# Patient Record
Sex: Female | Born: 1975 | Race: White | Hispanic: No | Marital: Married | Smoking: Never smoker
Health system: Southern US, Academic
[De-identification: ages and names within clinical notes are randomized; demographics above are authoritative.]

## PROBLEM LIST (undated history)

## (undated) DIAGNOSIS — E039 Hypothyroidism, unspecified: Secondary | ICD-10-CM

## (undated) DIAGNOSIS — E119 Type 2 diabetes mellitus without complications: Secondary | ICD-10-CM

## (undated) DIAGNOSIS — N83209 Unspecified ovarian cyst, unspecified side: Secondary | ICD-10-CM

## (undated) DIAGNOSIS — F411 Generalized anxiety disorder: Secondary | ICD-10-CM

## (undated) DIAGNOSIS — M5116 Intervertebral disc disorders with radiculopathy, lumbar region: Secondary | ICD-10-CM

## (undated) DIAGNOSIS — E041 Nontoxic single thyroid nodule: Secondary | ICD-10-CM

## (undated) DIAGNOSIS — E559 Vitamin D deficiency, unspecified: Secondary | ICD-10-CM

## (undated) DIAGNOSIS — I10 Essential (primary) hypertension: Secondary | ICD-10-CM

## (undated) DIAGNOSIS — M1990 Unspecified osteoarthritis, unspecified site: Secondary | ICD-10-CM

## (undated) DIAGNOSIS — M25562 Pain in left knee: Secondary | ICD-10-CM

## (undated) DIAGNOSIS — E669 Obesity, unspecified: Secondary | ICD-10-CM

## (undated) DIAGNOSIS — M25561 Pain in right knee: Secondary | ICD-10-CM

## (undated) DIAGNOSIS — K529 Noninfective gastroenteritis and colitis, unspecified: Secondary | ICD-10-CM

## (undated) DIAGNOSIS — K219 Gastro-esophageal reflux disease without esophagitis: Secondary | ICD-10-CM

## (undated) DIAGNOSIS — I83899 Varicose veins of unspecified lower extremities with other complications: Secondary | ICD-10-CM

## (undated) DIAGNOSIS — K7581 Nonalcoholic steatohepatitis (NASH): Secondary | ICD-10-CM

## (undated) HISTORY — DX: Essential (primary) hypertension: I10

## (undated) HISTORY — PX: HX GALL BLADDER SURGERY/CHOLE: SHX55

## (undated) HISTORY — DX: Pain in right knee: M25.561

## (undated) HISTORY — DX: Pain in left knee: M25.562

## (undated) HISTORY — DX: Nontoxic single thyroid nodule: E04.1

## (undated) HISTORY — DX: Intervertebral disc disorders with radiculopathy, lumbar region: M51.16

## (undated) HISTORY — DX: Type 2 diabetes mellitus without complications: E11.9

## (undated) HISTORY — DX: Nonalcoholic steatohepatitis (NASH): K75.81

## (undated) HISTORY — DX: Noninfective gastroenteritis and colitis, unspecified: K52.9

## (undated) HISTORY — DX: Generalized anxiety disorder: F41.1

## (undated) HISTORY — DX: Gastro-esophageal reflux disease without esophagitis: K21.9

## (undated) HISTORY — DX: Unspecified ovarian cyst, unspecified side: N83.209

## (undated) HISTORY — DX: Unspecified osteoarthritis, unspecified site: M19.90

## (undated) HISTORY — PX: CARDIAC CATHETERIZATION: SHX172

## (undated) HISTORY — PX: COLONOSCOPY: WVUENDOPRO10

## (undated) HISTORY — DX: Vitamin D deficiency, unspecified: E55.9

## (undated) HISTORY — DX: Obesity, unspecified: E66.9

## (undated) HISTORY — PX: HX UPPER ENDOSCOPY: 2100001144

## (undated) HISTORY — DX: Hypothyroidism, unspecified: E03.9

## (undated) HISTORY — PX: HX ENDOMETRIAL ABLATION: 2100001129

## (undated) HISTORY — DX: Varicose veins of unspecified lower extremity with other complications: I83.899

---

## 2003-04-01 ENCOUNTER — Other Ambulatory Visit (HOSPITAL_COMMUNITY): Payer: Self-pay | Admitting: OBSTETRICS/GYNECOLOGY

## 2021-04-06 IMAGING — MG 3D SCREENING MAMMO BIL W/CAD & TOMO
5 series · 8 of 24 positions shown · non-contrast
Comparison: Study dated 06/18/2020.

------------- REPORT [id number] -------------
[facility] [place]
[address]
Tiger Ms.KOCI, GALO PATRICIO:
We wish to report the following on your recent mammography examination. We are sending a report to your referring physician or other health care provider. 
(       Normal/Negative:
No evidence of cancer.
This statement is mandated by the [place], Department of Health.
Your examination was performed by one of our technologists, who are registered radiological technologists and also specially certified in mammography:
___
Markland, Marjuan (M)

Your mammogram was interpreted by our radiologist.
( 
Collette Sedman, M.D.
(Annual Breast Examination by a physician or other health care provider
(Annual Mammography Screening beginning at age 39
(Monthly Breast Self Examination
------------- REPORT [id number] -------------
﻿
CIPOLLA, TETYANA
EXAM:  BILATERAL DIGITAL SCREENING MAMMOGRAM WITH 3D TOMOSYNTHESIS AND CAD
INDICATION: Asymptomatic 44-year-old with no family history of breast cancer in first-degree relatives.  Lifetime breast cancer risk 11.5%.

[R]
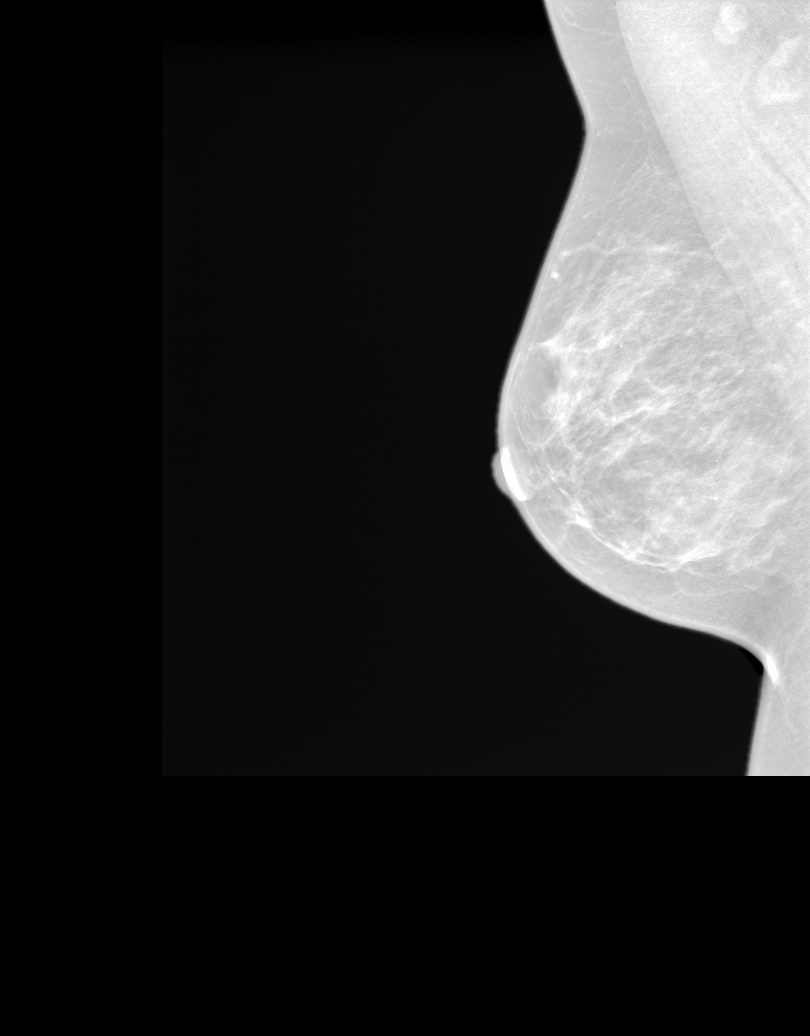

[L]
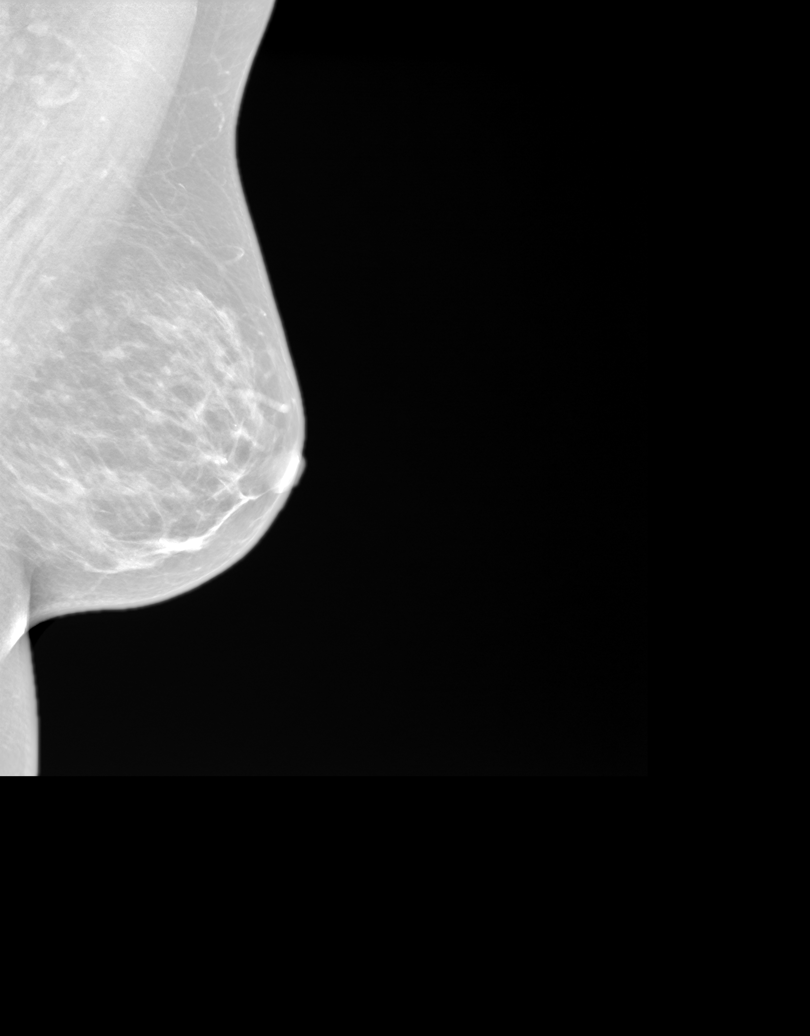

[R CC tomo · right · 0.10mm/px · 2 of 2 slices shown]
[im 1/2]
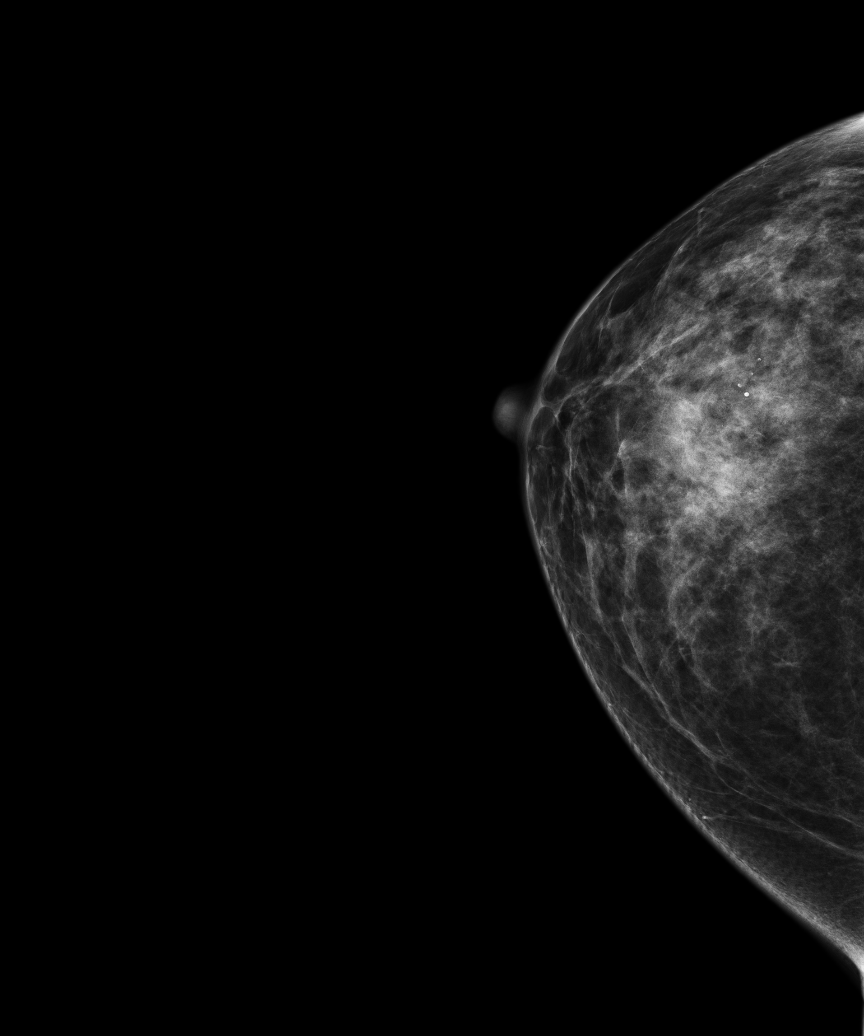
[im 2/2]
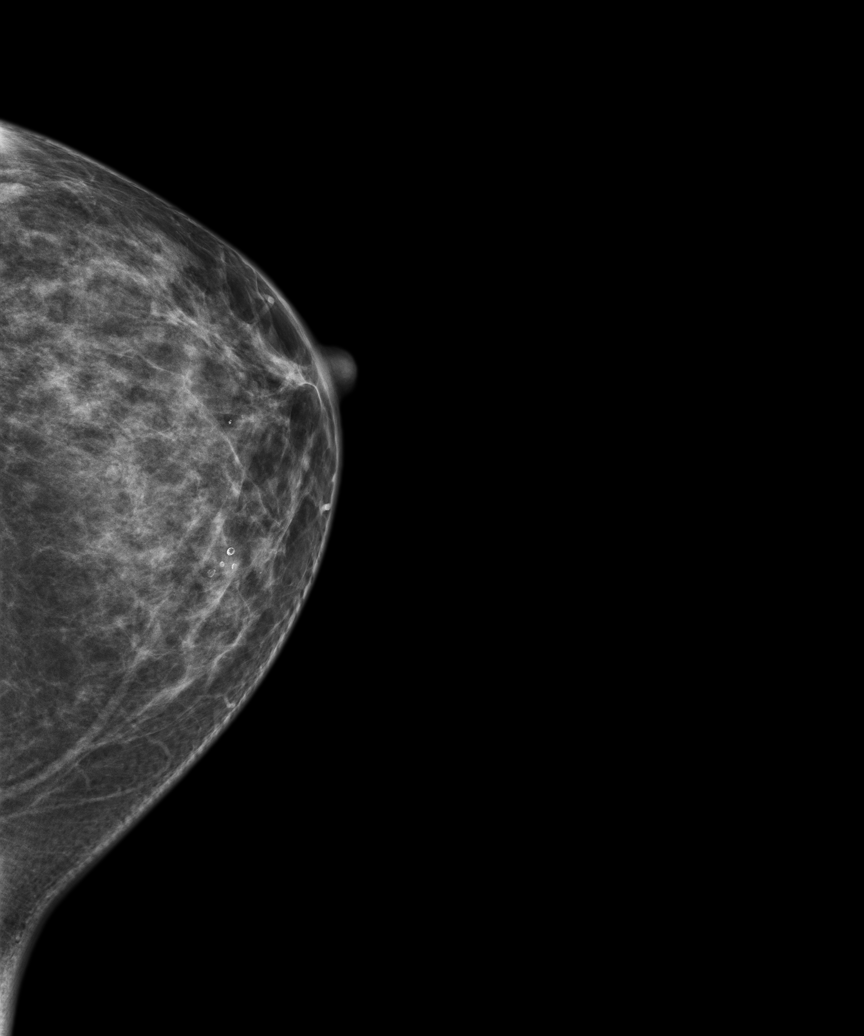

[3D SCREENING MAMMO BIL W/CAD & TOMO tomo · 2 acquisitions, 3 frames shown (1 of 2)]
[im 1/2]
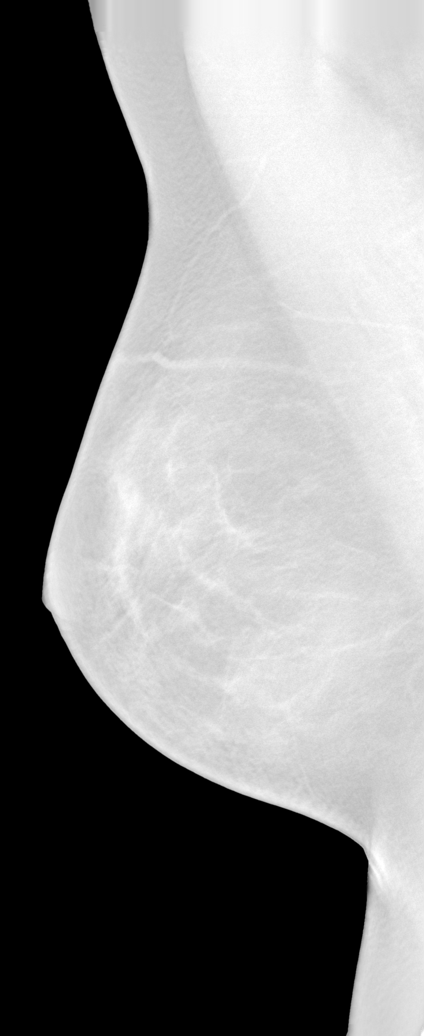
[im 2/2]
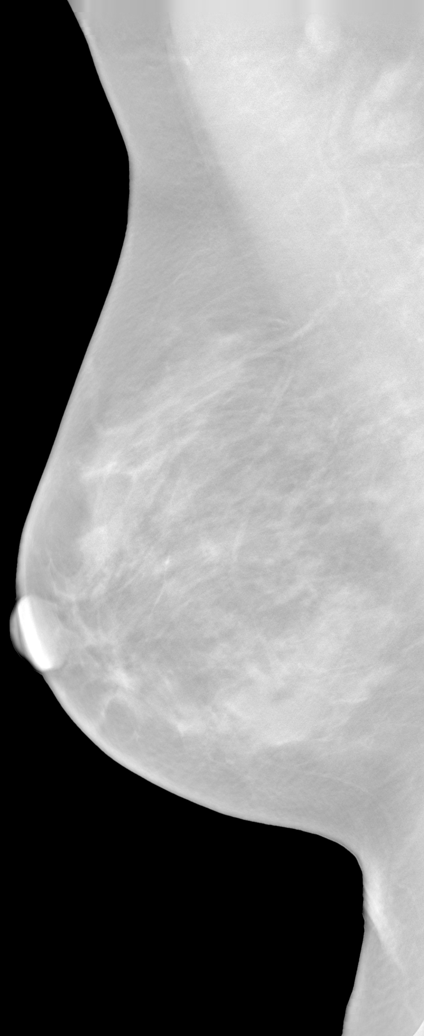
[im 2/2]
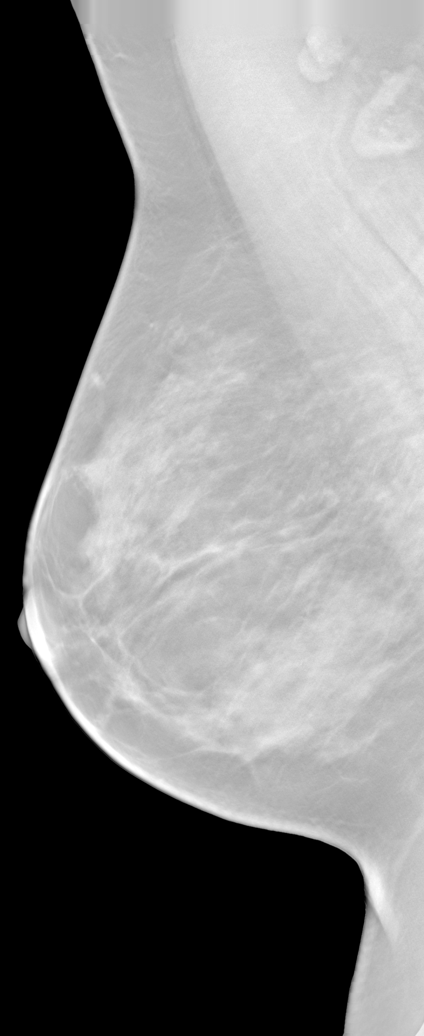

[3D SCREENING MAMMO BIL W/CAD & TOMO tomo (2 of 2) · tomo slice 10/63.0]
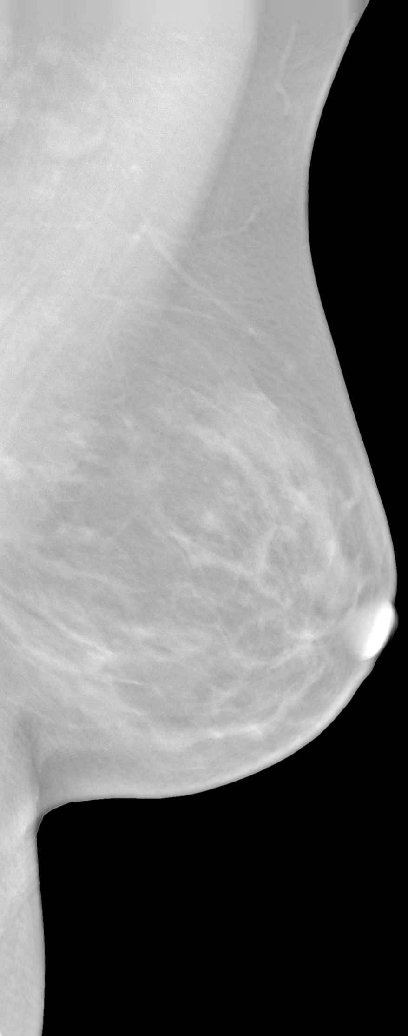

[8 of 24 positions shown; findings below may reference images not displayed]

FINDINGS: No focal mass or architectural changes are noted.  Asymmetry of density in the inferior right breast compared to the left side is stable as well as benign lymph nodes in the axillae with fatty hila.
IMPRESSION: 1.  Stable mammographic findings.  Clinical and mammographic followup at 12 months. 

2.  BIRADS 2-Benign findings. Patient has been added in a reminder system with a target date for the next screening mammography.

3.  DENSITY CODE – B (Scattered areas of fibroglandular density). 

Final Assessment Code:

Bi-Rads 2 

BI-RADS 0
 Need additional imaging evaluation.

BI-RADS 1
 Negative mammogram.

BI-RADS 2
 Benign finding.

BI-RADS 3
 Probably benign finding; short-interval follow-up suggested.

BI-RADS 4
 Suspicious abnormality; biopsy should be considered.

BI-RADS 5
 Highly suggestive of malignancy; appropriate action should be taken.

BI-RADS 6
 Known biopsy-proven malignancy; appropriate action should be taken.

NOTE:
In compliance with Federal regulations, the results of this mammogram are being sent to the patient.

## 2022-01-02 ENCOUNTER — Other Ambulatory Visit (RURAL_HEALTH_CENTER): Payer: Self-pay | Admitting: Internal Medicine

## 2022-01-21 ENCOUNTER — Encounter (RURAL_HEALTH_CENTER): Payer: Self-pay | Admitting: Internal Medicine

## 2022-01-21 DIAGNOSIS — K529 Noninfective gastroenteritis and colitis, unspecified: Secondary | ICD-10-CM | POA: Insufficient documentation

## 2022-01-21 DIAGNOSIS — E559 Vitamin D deficiency, unspecified: Secondary | ICD-10-CM | POA: Insufficient documentation

## 2022-01-21 DIAGNOSIS — I83899 Varicose veins of unspecified lower extremities with other complications: Secondary | ICD-10-CM | POA: Insufficient documentation

## 2022-01-21 DIAGNOSIS — E119 Type 2 diabetes mellitus without complications: Secondary | ICD-10-CM | POA: Insufficient documentation

## 2022-01-21 DIAGNOSIS — M25562 Pain in left knee: Secondary | ICD-10-CM

## 2022-01-21 DIAGNOSIS — E039 Hypothyroidism, unspecified: Secondary | ICD-10-CM | POA: Insufficient documentation

## 2022-01-21 DIAGNOSIS — M1990 Unspecified osteoarthritis, unspecified site: Secondary | ICD-10-CM | POA: Insufficient documentation

## 2022-01-21 DIAGNOSIS — E041 Nontoxic single thyroid nodule: Secondary | ICD-10-CM | POA: Insufficient documentation

## 2022-01-21 DIAGNOSIS — F411 Generalized anxiety disorder: Secondary | ICD-10-CM | POA: Insufficient documentation

## 2022-01-21 DIAGNOSIS — N83209 Unspecified ovarian cyst, unspecified side: Secondary | ICD-10-CM | POA: Insufficient documentation

## 2022-01-21 DIAGNOSIS — I10 Essential (primary) hypertension: Secondary | ICD-10-CM | POA: Insufficient documentation

## 2022-01-21 DIAGNOSIS — E669 Obesity, unspecified: Secondary | ICD-10-CM

## 2022-01-21 DIAGNOSIS — M5116 Intervertebral disc disorders with radiculopathy, lumbar region: Secondary | ICD-10-CM | POA: Insufficient documentation

## 2022-01-21 DIAGNOSIS — K219 Gastro-esophageal reflux disease without esophagitis: Secondary | ICD-10-CM | POA: Insufficient documentation

## 2022-01-21 DIAGNOSIS — K7581 Nonalcoholic steatohepatitis (NASH): Secondary | ICD-10-CM | POA: Insufficient documentation

## 2022-01-21 DIAGNOSIS — M25561 Pain in right knee: Secondary | ICD-10-CM | POA: Insufficient documentation

## 2022-01-21 NOTE — Progress Notes (Signed)
[place] [facility]  Patient:  Sue Rose, Sue Rose #: [id number]  Date of Service: 10/15/21 MR #: [id number]  Provider: Rico Ala P.A.C. PCP: Rico Ala P.A.C.  DOB: [birth year] Age/Sex: 45/F Referring Provider:     Internal Medicine VisitSignedHPI  Adult Physical-AMB  [id number]  Pneumo 23 status: Unimmunized  Immunization given today: no  PCV13/PREVNAR  Prevnar 13 status: Unimmunized  Immunization given today: no  PREVNAR 20  Prevnar 20 status: Unimmunized  Immunization given today: no  COVID 19  Covid immunization status: Unimmunized  Immunization given today: no  INFLUENZA VACCINE  Flu immunization status: Unimmunized  Immunization given today: no  TETANUS/DIPTHERIA/PERTUSSIS Tdap  Immunization Status: Unimmunized  Immunization given today: no  SHINGRIX VACCINE  Immunization Status: Unimmunized  Immunization given today: no  BREAST CANCER SCREENING  Guidelines: 1.) Women of average risk should be given the option of annual screening starting at age 65.  2.) Women with increased risk (breast cancer in parent, sibling, child, BRCA 1/BRCA2 positive) may require more intense screening. Medicare allows one baseline Mammogram at age 52-39, then 1 annually.  BREAST CANCER RISK  Personal history of breast cancer: no  Family history of breast cancer: no  GYNE HX/STD PREVENTION/FAMILY PLANNING  Applicable history only  Menopause concerns: Denies Weight gain, Decreased libido, Other, None, Hot flashes or Vaginal dryness  Menstrual character: Denies normal, abnormally heavy, irregular in timing, absent, prolonged or other  Gynecologic pain symptoms: Denies None, Dysmenorrhea, Chronic noncyclic pelvic pain, Dyspareunia, Pelvic pressure or Other  Urogynecologic Symptoms: Reports Urinary urgency and Urinary frequency; Denies Incontinence, Leakage with cough/sneeze/strain/laugh, Post void dribbling or Other  UTI in the last 6month?:: No  PAP SMEAR/HPV,CHLAMYDIA  TESTING/ PELVIC  GUIDELINES:Low risk cervical/vaginal Ca  Under age 552 No pap smears  Chlamydia Screeening: Do on Sexually Active females under age 47  Ages 21-65: Cytology alone, every 3 years.  Age 47- 315 Cytology every 3 years with reflex to HRHPV screening if abnormal.  Age 47- 688 Cytology and HRHPV every 5 years if prior screening adequate and normal OR Cytology alone every 3years.  Age over 644 No further Pap Smears if prior screening adequate.  Prior Hysterectomy: No further screening if surgery was done for non cancerous reasons.  Pelvic Exam suggested every 250yrfor low risk patients.  GUIDELINES:High risk cervical/vaginal Ca  Those with hx of C1N2, C1N3, hx of Cervical CA, those in child bearing age w/abnormal pap within last 3 yrs. Annual Exam required x2011yr HIV/HEP B/C RISK SCREENING  GUIDELINES: HIV: Offer screening ages 15-21-65nually without regard to perceived risk.  Annually for those younger than 15 71d older than 65 83o are at increased risk.  Hep B: Screen only high risk.  Hep C: Screen once regardless of risk, age 92-79 with HCV Ab testing with PCR confirmation for abnormal results. Screen again only if new exposure.  PATIENT HIV/HEPB/HEPC RISK ASSESSMENT  RISK ASSESSMENT:: Reports Tattoos; Denies IV drug user, Blood transfusion before 1992, Household member has HIV/HEPTB/C, Needlestick injury, Occupational risk, Patient Hept B immunized or Other  COLON CANCER SCREENING  Patients are AVERAGE RISK IF ALL OF THE BELOW ARE NEGATIVE.: denies Personal hx of colon cancer  IF patient is AVERAGE RISK USPTF Guidelines are as follows:  * Offer screening starting at age 61 4d continue to age 59 61 recommendation for age 39-40-75d B recommendation age 61-72-49* Discuss benefits vs risks of screening between age 53-16-85nsidering patients overall  health, prior screening hx and patient preferences, C recommendation)  * STOP screening at age 48  OPTIONS for testing are as follows:  *  High-sensitivity gFOBT or FIT every year  * sDNA-FIT every 1-3 years  * CT colonography every 5 years  * Flexible sigmoidoscopy every 5 years  * Flexible sigmoidoscopy every 10 years PLUS FIT every year  * Colonoscopy screening every 10 years  NOTE:  Any abnormality detected on screening test should be followed by a DIAGNOSTIC colonoscopy.  OSTEOPOROSIS SCREENING/TREATMENT  2018 USPSTF Recommendations for Primary Prevention of Osteoporotic Fracture:  FEMALE GENOTYPE 65 and OVER:  The USPSTF recommends screening for osteoporosis with bone measurement testing to prevent osteoporotic fractures in women 65 years and older. (B recommendation)  The USPSTF recommends screening for osteoporosis with bone measurement testing to prevent osteoporotic fractures in postmenopausal women younger than 65 years at increased risk of osteoporosis,  as determined by a formal clinical risk assessment tool. (B recommendation):  FEMALE GENOTYPE UNDER AGE 42:  Risk Assessment:: denies Parental Hx of Hip fracture  In addition, menopausal status in women is also an important consideration. For postmenopausal women younger than 56 years who have at least 1 of the above risk factors,  a reasonable approach to determine who should be screened with bone measurement testing is to use a clinical risk assessment tool.  Several tools are available to assess osteoporosis risk, such as OST (less than 2), ORAI (9 or greater), OSIRIS (less than 1), SCORE (6 or greater) and FRAX (overall risk >8.4%).  The OST tool is suggested due to simplicity and no evidence that it is inferior to others.  FEMALE GENOTYPE:  The USPSTF concludes that the current evidence is insufficient to assess the balance of benefits and harms of screening for osteoporosis to prevent osteoporotic fractures in men. (I statement)  PREVIOUS BONE DENSITY TESTING  VISION/GLAUCOMA SCREENING  Recommendation: Yearly to biyearly exam with Family history, Diabetes, African American (age50),  Hispanic American.  Findings: Reports Need for glasses; Denies Cataracts, Glaucoma, DM retinopathy or Macular Degeneration  Vision Acuity Performed Today  DEPRESSION SCREENING  1. Little interest or pleasure in doing things: not at all  2. Feeling down, depressed, or hopeless: several days  PHQ-2: Total score: 1  NUTRITIONAL ASSESSMENT  Dietary habits: Reports well-balanced diet, eats out and daily servings of fruits and vegetables  1. How many times a week did you eat fast food meals or snacks?: 1-3 times  2. How many servings of fruit did you eat each day?: 1-3 times  3. How many servings of vegetables did you eat each day?: 5 or more  4. How many nights a week do you eat suppers prepared at home?: 4 or more times  VITAMIN SUPPLEMENT USE  Do you take any of the following vitamins?: Reports Vitamin D  CALCIUM INTAKE  Dairy product consumption:: Reports Milk, Yogurt and Cheese; Denies Icecream  EXERCISE HABITS  Patient reports the following activites:: Denies Walking, Hiking, Running, Swimming, Basketball/Volleyball/Soccer, Racquet sports, Exercise classes, Golfing, Cycling, Rowing, Fishing, [employer] lifting, Yoga, TaiChi, Karate, Boxing or Other  SKIN CANCER RISK ASSESSMENT  History of Skin Cancer: No  Fair Skin: yes  History of SunBurn: yes  Uses SunScreen: yes  ALCOHOL MISUSE SCREENING  GUIDELINES: Current for safe consumption  Men 56 and older. ALL Women: < 7 drinks per week or < 3 drinks per occasion.  Men under age 68: < 14 drinks per week or < 4 drinks per occasion.  How often do you have a drink containing alcohol?: never  How often do you have 6 or more drinks on 1 occasion?: never  CARDIOVASCULAR RISK EVALUATION  Risk Factors: Reports Hypertension, Diabetes and Family Hx of Heart Disease  Fasting lipid panel: Medicare covers all beneficiaries without apparent signs or symptoms of cardiovascular disease once every 5 years.  ADVANCED CARE PLANNING  Advance Directives: No  Does the patient have a Medical Power  of Attorney?: No  Living Will: No  [place] DNR: No  SAFETY  Car Safety: Wears a seatbelt and Never texts and drives  Home safety: Reports water heater temperature set to <120 degrees, has working Oceanographer in home, has a Data processing manager in the home and has a working carbon Media planner in the home      Intake  Vital Signs      10/15/21  15:38  Height   5 ft 5 in  Weight   244 lb  BMI   40.6  BP   158/90 H  Blood Pressure Location   Rt femoral  Position   Sitting  Pulse   82  Pulse Source   Monitor  Temp   98.3 F  Temp Source   Tympanic  Pulse Oximetry (%)   100  Oxygen Delivery Method   room air    Chief Complaint:  adult health maintenance  Add today's problem/HPI: Adult health maintenance  Allergies    Allergy/AdvReac Type Severity Reaction Status Date / Time  ACE Inhibitors AdvReac Mild Cough Verified 01/24/21 11:58      Medication List     Medication  Instructions  Recorded  Confirmed  cholecalciferol (vitamin D3) 1,250 50,000 unit PO QWEEK #12 cap 06/07/21 10/27/21  mcg (50,000 unit) capsule        famotidine 40 mg tablet 40 mg PO BID 90 Days #180 tab 06/07/21 10/27/21  hydrochlorothiazide 25 mg tablet 25 mg PO QAM #90 tab 06/07/21 10/27/21  levothyroxine 50 mcg tablet See Rx Instructions .ROUTE 06/07/21 10/27/21    .COMPLEX #90 tab      olmesartan 40 mg tablet (Benicar) 40 mg PO QDAY #90 tab 06/07/21 10/27/21  omeprazole 40 mg capsule,delayed 40 mg PO QDAY #90 cap 06/07/21 10/27/21  release        celecoxib 200 mg capsule (Celebrex) 200 mg PO BID #60 cap 06/08/21 10/27/21  montelukast 10 mg tablet 10 mg PO QDAY #30 tab 06/29/21 10/27/21  cetirizine 10 mg tablet (Zyrtec) 10 mg PO QDAY PRN 10/15/21 10/27/21  duloxetine 30 mg capsule,delayed 30 mg PO QDAY #30 cap 10/15/21 10/15/21  release (Cymbalta)        sitagliptin phosphate 50 mg tablet 50 mg PO QDAY #30 tab 10/15/21 10/15/21  (Januvia)        fluconazole 100 mg tablet 100 mg PO QDAY #5 tab 10/23/21    (Diflucan)            Patient  bottles, verbal confirmation, RX history and old medical records are used to get the best medication list possible.: MA- Meds Reviewed and Provider reconciled      Patient Problem List/History  Active Problem List (Updated 10/27/21 @ 18:35 by Rico Ala, P.A.C.)    Type 2 diabetes mellitus without complication (Acute) A16.5  Leg pain, bilateral (Acute) M79.604, M79.605  Symptomatic varicose veins (Acute) I83.899  Lab test positive for detection of COVID-19 virus (Acute) U07.1  GAD (generalized anxiety disorder) (Acute) F41.1  Dermatitis (Acute) L30.9  Other abnormal glucose (  Acute) R73.09  Head ache (Acute) R51.9  Gastroenteritis (Acute) K52.9  Obesity (Acute) E66.9  Bilateral knee pain (Acute) M25.561, M25.562  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 (Acute) M51.16  DJD (degenerative joint disease), lumbar (Acute) M47.816  Thyroid nodule (Acute) E04.1  Vitamin D deficiency (Acute) E55.9  NASH (nonalcoholic steatohepatitis) (Acute) K75.81  GERD (gastroesophageal reflux disease) (Acute) K21.9  Essential hypertension (Acute) I10  Back pain (Acute) M54.9  Pain of left lower extremity (Acute) M79.605  Hypothyroidism E03.9      Past Medical History (Updated 10/27/21 @ 18:35 by Rico Ala, P.A.C.)    Cyst of ovary N83.209  Lab test positive for detection of COVID-19 virus U07.1    Surgical History (Updated 10/15/21 @ 16:13 by Rico Ala, P.A.C.)    History of cholecystectomy Z90.49  History of gynecologic surgery Z98.890  ablation late  30's  Previous cesarean section Z98.891        ROS  Const  Denies body aches, Denies chills, Denies fatigue and Denies fever(s)  Eyes  Denies diplopia, Denies loss of vision and Denies spots in vision  ENT  Denies dizziness, Reports post nasal drip and Denies sinus pain  Card  Reports chest pain, Denies pedal edema, Denies leg edema and Denies dyspnea  Resp  Denies chest congestion, Denies cough and Denies dyspnea  GI  Denies diarrhea, Denies  nausea and Denies vomiting  Neuro  Denies dizziness and Denies loss of vision  Endo  Denies fatigue      Exam-AMB  EXAM  EXAM:  pt crying at times during this visit   Const  General: cooperative and no acute distress  Orientation/Consciousness: patient oriented x3  HENMT  Ears: hearing grossly normal bilaterally, TM normal on the right and TM normal on the left  General nose exam: nares normal  Eyes  General: appearance normal, both eyes and all related structures  Conjunctivae: conjunctivae normal  Sclera: sclerae normal  EOM: EOM intact bilaterally  Neck  Neck: normal visual inspection and no lymphadenopathy  Thyroid: thyroid normal  Carotids: no bruits  Resp  Effort & Inspection: normal respiratory effort  Auscultation: clear to auscultation bilaterally, no crackles, no rales, no rhonchi, no wheezes and no rubs  Tactile Fremitus: tactile fremitus absent  Cardio  Jugular venous pressure: no JVD  Rate: regular rate  Rhythm: regular rhythm  Heart Sounds: S1 normal, S2 normal, no click, no murmurs and no rubs  Bruits: no carotid bruits  Pulses: normal peripheral pulses  GI  Inspection: Yes normal to inspection  Palpation: soft, no hepatosplenomegaly, no guarding, no masses and nontender  Auscultation: normal bowel sounds  GU  Other:  declined  gyn  Neuro  General: patient oriented x3  Psych  Appearance: grossly normal  Mental Status: mental status grossly normal  Speech and Movement: speech clear  Mood: congruent mood  Affect: sad  Attitude: cooperative  Thought Process: normal  Thought Content: normal  Insight: insight good  Judgment: judgment good        A/P  Assessment & Plan  (1) Encounter for Health Maintenance Examination in Adult:        Code(s):  Z00.00 - Encounter for general adult medical examination without abnormal findings  (2) Essential hypertension:        Status: Acute        Code(s):  I10 - Essential (primary) hypertension  (3) Leg pain, bilateral:        Status: Acute  Code(s):  M79.604 - Pain  in right leg; M79.605 - Pain in left leg  (4) Vitamin D deficiency:        Status: Acute        Code(s):  E55.9 - Vitamin D deficiency, unspecified  (5) Symptomatic varicose veins:        Status: Acute        Code(s):  I83.899 - Varicose veins of unspecified lower extremity with other complications        Qualifiers:          Laterality: bilateral  Qualified Code(s): [phone] - Varicose veins of bilateral lower extremities with other complications  (6) Hypothyroidism:        Status: None        Code(s):  E03.9 - Hypothyroidism, unspecified        Qualifiers:          Hypothyroidism type: acquired  Qualified Code(s): E03.9 - Hypothyroidism, unspecified  (7) GAD (generalized anxiety disorder):        Status: Acute        Code(s):  F41.1 - Generalized anxiety disorder  (8) Obesity:        Status: Acute        Code(s):  E66.9 - Obesity, unspecified        Qualifiers:          Body mass index: BMI 40.0-44.9  Obesity classification: adult class 3 (BMI >= 40)  Obesity type: unspecified obesity type  Serious obesity comorbidity presence: without serious comorbidity  Qualified Code(s): E66.01 - Morbid (severe) obesity due to excess calories; Z68.41 - Body mass index [BMI]40.0-44.9, adult  (9) NASH (nonalcoholic steatohepatitis):        Status: Acute        Code(s):  T61.44 - Nonalcoholic steatohepatitis (NASH)  (10) GERD (gastroesophageal reflux disease):        Status: Acute        Code(s):  K21.9 - Gastro-esophageal reflux disease without esophagitis        Qualifiers:          Esophagitis presence: without esophagitis  Qualified Code(s): K21.9 - Gastro-esophageal reflux disease without esophagitis  (11) Bilateral knee pain:        Status: Acute        Code(s):  M25.561 - Pain in right knee; M25.562 - Pain in left knee        Qualifiers:          Chronicity: acute  Qualified Code(s): M25.561 - Pain in right knee; M25.562 - Pain in left knee  (12) Type 2 diabetes mellitus without complication:        Status: Acute         Code(s):  E11.9 - Type 2 diabetes mellitus without complications        Qualifiers:          Diabetes mellitus long term insulin use: without long term use  Qualified Code(s): E11.9 - Type 2 diabetes mellitus without complications    Plan  wellness on top of acute  complaints   Meds reviewed as well as labs.  Chart reviewed and updated  Continue current treatment  Keep follow-up appointment    Co Several months of  symptoms   few months ago quit her job  as she has been  more anxious and depressed   cries easily  and  finaly  just  quit  work      pt was coming in late  Leaving work did not  help her anxiety          when anxiety comes on  she  feels  she is out of breath   with exertion at times    Pt had  ativan  during  covid feels she needs something   due to chronic  and acute pain  discussed   cymbalta low dose   as pt has trouble tolerating meds     In her 30's pt was having  chest pain  numbness  in face  and had   ativan    no suicidal or homocidal ideations   in 1 week period  1 x per week   few times  per  week  feels down         Pt had ablation   no menses  and does get hot flashes      Co CP on and off   with and wo exertion  no sweats   30 min  in  length      bra  feels tight at time   and exertional sob        co pain in knees and legs     seen by  Ortho Dr Margretta Sidle   no surgery  no inj     on celebrex  200 bid  some improvement   stiffness is  worse   hard to get around    Jardiance made her  sick to stomach    never called to let us know as I could have decreased dose     Trulicity   tried and nausea          bs   254   3 pm   only  ate breakfast  this am   bs  have not been being checked                Meds reviewed as well as labs.  Chart reviewed and updated  Continue current treatment  Keep follow-up appointment            Orders:  Orders  Comprehensive Metabolic Panel 12/87/86 V67 - Essential (primary) hypertension, M79.604 - Pain in right leg, M79.605 - Pain in left leg, F41.1 -  Generalized anxiety disorder, E66.01 - Morbid (severe) obesity due to excess calories, Z68.41 - Body mass index [BMI] 40.0-44.9, adult, M25.561 - Pain in right knee, M25.562 - Pain in left knee, E55.9 - Vitamin D deficiency, unspecified, M09.47 - Nonalcoholic steatohepatitis (NASH), K21.9 - Gastro-esophageal reflux disease without esophagitis, E03.9 - Hypothyroidism, unspecified    Vitamin B12 10/16/21 E53.8 - Deficiency of other specified B group vitamins, M79.604 - Pain in right leg, M79.605 - Pain in left leg, F41.1 - Generalized anxiety disorder, E66.01 - Morbid (severe) obesity due to excess calories, Z68.41 - Body mass index [BMI] 40.0-44.9, adult, M25.561 - Pain in right knee, M25.562 - Pain in left knee, E55.9 - Vitamin D deficiency, unspecified, S96.28 - Nonalcoholic steatohepatitis (NASH), K21.9 - Gastro-esophageal reflux disease without esophagitis, I10 - Essential (primary) hypertension, E03.9 - Hypothyroidism, unspecified    Thyroid Stimulating Hormone 10/16/21 E03.9 - Hypothyroidism, unspecified, M79.604 - Pain in right leg, M79.605 - Pain in left leg, F41.1 - Generalized anxiety disorder, E66.01 - Morbid (severe) obesity due to excess calories, Z68.41 - Body mass index [BMI] 40.0-44.9, adult, M25.561 - Pain in right knee, M25.562 - Pain in left knee, E55.9 - Vitamin D deficiency, unspecified, Z66.29 - Nonalcoholic steatohepatitis (  NASH), K21.9 - Gastro-esophageal reflux disease without esophagitis, I10 - Essential (primary) hypertension    VIT D25(OH) TOT 10/16/21 E55.9 - Vitamin D deficiency, unspecified, M79.604 - Pain in right leg, M79.605 - Pain in left leg, F41.1 - Generalized anxiety disorder, E66.01 - Morbid (severe) obesity due to excess calories, Z68.41 - Body mass index [BMI] 40.0-44.9, adult, M25.561 - Pain in right knee, M25.562 - Pain in left knee, Z00.17 - Nonalcoholic steatohepatitis (NASH), K21.9 - Gastro-esophageal reflux disease without esophagitis, I10 - Essential (primary)  hypertension, E03.9 - Hypothyroidism, unspecified    Complete Blood Count Auto Diff 10/16/21 R53.83 - Other fatigue, M79.604 - Pain in right leg, M79.605 - Pain in left leg, F41.1 - Generalized anxiety disorder, E66.01 - Morbid (severe) obesity due to excess calories, Z68.41 - Body mass index [BMI] 40.0-44.9, adult, M25.561 - Pain in right knee, M25.562 - Pain in left knee, E55.9 - Vitamin D deficiency, unspecified, C94.49 - Nonalcoholic steatohepatitis (NASH), K21.9 - Gastro-esophageal reflux disease without esophagitis, I10 - Essential (primary) hypertension, E03.9 - Hypothyroidism, unspecified    ANA Multiplex w/rfx 11ab [place] 10/16/21 M79.604 - Pain in right leg, M79.605 - Pain in left leg, F41.1 - Generalized anxiety disorder, E66.01 - Morbid (severe) obesity due to excess calories, Z68.41 - Body mass index [BMI] 40.0-44.9, adult, M25.561 - Pain in right knee, M25.562 - Pain in left knee, E55.9 - Vitamin D deficiency, unspecified, Q75.91 - Nonalcoholic steatohepatitis (NASH), K21.9 - Gastro-esophageal reflux disease without esophagitis, I10 - Essential (primary) hypertension, E03.9 - Hypothyroidism, unspecified    Rheumatoid Factor by Turb(RDL) 10/16/21 M79.604 - Pain in right leg, M79.605 - Pain in left leg, F41.1 - Generalized anxiety disorder, E66.01 - Morbid (severe) obesity due to excess calories, Z68.41 - Body mass index [BMI] 40.0-44.9, adult, M25.561 - Pain in right knee, M25.562 - Pain in left knee, E55.9 - Vitamin D deficiency, unspecified, M38.46 - Nonalcoholic steatohepatitis (NASH), K21.9 - Gastro-esophageal reflux disease without esophagitis, I10 - Essential (primary) hypertension, E03.9 - Hypothyroidism, unspecified    Follicle Stimulating Hormone 10/16/21 M79.604 - Pain in right leg, M79.605 - Pain in left leg, F41.1 - Generalized anxiety disorder, E66.01 - Morbid (severe) obesity due to excess calories, Z68.41 - Body mass index [BMI] 40.0-44.9, adult, M25.561 - Pain in right knee, M25.562 -  Pain in left knee, E55.9 - Vitamin D deficiency, unspecified, K59.93 - Nonalcoholic steatohepatitis (NASH), K21.9 - Gastro-esophageal reflux disease without esophagitis, I10 - Essential (primary) hypertension, E03.9 - Hypothyroidism, unspecified    Luteinizing Hormone 10/16/21 M79.604 - Pain in right leg, M79.605 - Pain in left leg, F41.1 - Generalized anxiety disorder, E66.01 - Morbid (severe) obesity due to excess calories, Z68.41 - Body mass index [BMI] 40.0-44.9, adult, M25.561 - Pain in right knee, M25.562 - Pain in left knee, E55.9 - Vitamin D deficiency, unspecified, T70.17 - Nonalcoholic steatohepatitis (NASH), K21.9 - Gastro-esophageal reflux disease without esophagitis, I10 - Essential (primary) hypertension, E03.9 - Hypothyroidism, unspecified    HEMOGLOBIN A1c 10/16/21 R73.9 - Hyperglycemia, unspecified, E11.9 - Type 2 diabetes mellitus without complications            Medications:  New  sitagliptin phosphate (Januvia) 50 mg PO QDAY 30 tabs 3RF      duloxetine (Cymbalta) 30 mg PO QDAY 30 caps 3RF        Please follow up in Follow Up:      4 Weeks (4-6 weeks fu depression    no cpx today )    QPP  Smoking risk assessment performed?:  Yes  Over age 31 do PHQ2  Over the last 2 weeks, how often have you been bothered by any of the following problems?  1. Little interest or pleasure in doing things: not at all  2. Feeling down, depressed, or hopeless: several days  PHQ-2 Adult: Total score: 1  Screen results-Adult: positive        Signed By:<Electronically signed by  Rico Ala P.A.C.>10/27/21 [phone]  Copies to:                  [facility] P[facility]., Joelene Millin

## 2022-01-22 ENCOUNTER — Inpatient Hospital Stay
Admission: RE | Admit: 2022-01-22 | Discharge: 2022-01-22 | Disposition: A | Discharge status: Home / Self Care | Payer: 59 | Source: Ambulatory Visit | Attending: Internal Medicine | Admitting: Internal Medicine

## 2022-01-22 ENCOUNTER — Other Ambulatory Visit: Payer: Self-pay

## 2022-01-22 ENCOUNTER — Ambulatory Visit (HOSPITAL_COMMUNITY): Payer: 59 | Admitting: Internal Medicine

## 2022-01-22 ENCOUNTER — Ambulatory Visit (RURAL_HEALTH_CENTER): Payer: 59 | Attending: Internal Medicine | Admitting: Internal Medicine

## 2022-01-22 ENCOUNTER — Encounter (RURAL_HEALTH_CENTER): Payer: Self-pay | Admitting: Internal Medicine

## 2022-01-22 VITALS — BP 130/92 | HR 84 | Temp 98.8°F | Ht 65.0 in | Wt 244.0 lb

## 2022-01-22 DIAGNOSIS — M25562 Pain in left knee: Secondary | ICD-10-CM | POA: Insufficient documentation

## 2022-01-22 DIAGNOSIS — Z7984 Long term (current) use of oral hypoglycemic drugs: Secondary | ICD-10-CM | POA: Insufficient documentation

## 2022-01-22 DIAGNOSIS — Z1231 Encounter for screening mammogram for malignant neoplasm of breast: Secondary | ICD-10-CM

## 2022-01-22 DIAGNOSIS — M25561 Pain in right knee: Secondary | ICD-10-CM | POA: Insufficient documentation

## 2022-01-22 DIAGNOSIS — Z6841 Body Mass Index (BMI) 40.0 and over, adult: Secondary | ICD-10-CM | POA: Insufficient documentation

## 2022-01-22 DIAGNOSIS — K7581 Nonalcoholic steatohepatitis (NASH): Secondary | ICD-10-CM | POA: Insufficient documentation

## 2022-01-22 DIAGNOSIS — E559 Vitamin D deficiency, unspecified: Secondary | ICD-10-CM | POA: Insufficient documentation

## 2022-01-22 DIAGNOSIS — M5410 Radiculopathy, site unspecified: Secondary | ICD-10-CM | POA: Insufficient documentation

## 2022-01-22 DIAGNOSIS — E039 Hypothyroidism, unspecified: Secondary | ICD-10-CM | POA: Insufficient documentation

## 2022-01-22 DIAGNOSIS — E782 Mixed hyperlipidemia: Secondary | ICD-10-CM | POA: Insufficient documentation

## 2022-01-22 DIAGNOSIS — I10 Essential (primary) hypertension: Secondary | ICD-10-CM | POA: Insufficient documentation

## 2022-01-22 DIAGNOSIS — R0602 Shortness of breath: Secondary | ICD-10-CM | POA: Insufficient documentation

## 2022-01-22 DIAGNOSIS — K439 Ventral hernia without obstruction or gangrene: Secondary | ICD-10-CM | POA: Insufficient documentation

## 2022-01-22 DIAGNOSIS — I83813 Varicose veins of bilateral lower extremities with pain: Secondary | ICD-10-CM | POA: Insufficient documentation

## 2022-01-22 DIAGNOSIS — R635 Abnormal weight gain: Secondary | ICD-10-CM | POA: Insufficient documentation

## 2022-01-22 DIAGNOSIS — Z8616 Personal history of COVID-19: Secondary | ICD-10-CM | POA: Insufficient documentation

## 2022-01-22 DIAGNOSIS — K219 Gastro-esophageal reflux disease without esophagitis: Secondary | ICD-10-CM | POA: Insufficient documentation

## 2022-01-22 DIAGNOSIS — R5383 Other fatigue: Secondary | ICD-10-CM

## 2022-01-22 DIAGNOSIS — E119 Type 2 diabetes mellitus without complications: Secondary | ICD-10-CM | POA: Insufficient documentation

## 2022-01-22 MED ORDER — TRIAMCINOLONE ACETONIDE 0.1 % TOPICAL CREAM
TOPICAL_CREAM | Freq: Two times a day (BID) | CUTANEOUS | 1 refills | Status: DC
Start: 2022-01-22 — End: 2022-08-13

## 2022-01-22 NOTE — Progress Notes (Signed)
Name: Sue Rose                       Date of Birth: [birth year]   MRN:  [id number]                         Date of visit: 01/22/2022     PCP: Samuella Cota, PA-C     Subjective  Sue Rose is a 46 y.o. year old female who presents for Follow Up 3 Months (Pt has an itch on her back on right lower for over a month would like you/To look at small rash. Her hernia has been bothering her some too./Still having fatigue.)   to clinic.  No specialty comments available.   Patient Active Problem List    Diagnosis Date Noted   . Acquired hypothyroidism 01/21/2022   . Bilateral knee pain 01/21/2022   . Cyst of ovary 01/21/2022   . Degenerative joint disease 01/21/2022   . Esophageal reflux 01/21/2022   . Essential hypertension 01/21/2022   . GAD (generalized anxiety disorder) 01/21/2022   . Gastroenteritis 01/21/2022   . NASH (nonalcoholic steatohepatitis) 01/21/2022   . Obesity, unspecified 01/21/2022   . 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 01/21/2022   . Symptomatic varicose veins 01/21/2022   . Thyroid nodule 01/21/2022   . Type 2 diabetes mellitus without complication (CMS HCC) 01/21/2022   . Vitamin D deficiency 01/21/2022      Current Outpatient Medications   Medication Sig   . celecoxib (CELEBREX) 200 mg Oral Capsule Take 1 Capsule (200 mg total) by mouth Twice daily   . cetirizine (ZYRTEC) 10 mg Oral Tablet Take 1 Tablet (10 mg total) by mouth Once a day   . cholecalciferol, vitamin D3, 1,250 mcg (50,000 unit) Oral Capsule Take 1,250 mcg by mouth Every 7 days (Patient not taking: Reported on 01/22/2022)   . DULoxetine (CYMBALTA DR) 30 mg Oral Capsule, Delayed Release(E.C.) Take 1 Capsule (30 mg total) by mouth Once a day (Patient not taking: Reported on 01/22/2022)   . ergocalciferol, vitamin D2, (DRISDOL) 1,250 mcg (50,000 unit) Oral Capsule Take 1 Capsule (50,000 Units total) by mouth Every 7  days   . famotidine (PEPCID) 40 mg Oral Tablet Take 1 Tablet (40 mg total) by mouth Twice daily   . hydroCHLOROthiazide (HYDRODIURIL) 25 mg Oral Tablet Take 1 Tablet (25 mg total) by mouth Once a day   . levothyroxine (SYNTHROID) 50 mcg Oral Tablet Take 1 Tablet (50 mcg total) by mouth Every morning   . metFORMIN (GLUCOPHAGE) 500 mg Oral Tablet Take 1 Tablet (500 mg total) by mouth Twice daily with food   . montelukast (SINGULAIR) 10 mg Oral Tablet Take 1 Tablet (10 mg total) by mouth Every evening   . olmesartan (BENICAR) 40 mg Oral Tablet Take 1 Tablet (40 mg total) by mouth Once a day   . omeprazole (PRILOSEC) 40 mg Oral Capsule, Delayed Release(E.C.) Take 1 Capsule (40 mg total) by mouth Once a day   . SITagliptin phosphate (JANUVIA) 50 mg Oral Tablet Take 1 Tablet (50 mg total) by mouth Once a day (Patient not taking: Reported on 01/22/2022)   . triamcinolone acetonide (ARISTOCORT A) 0.1 % Cream Apply topically Twice daily        Chronic Disease Management-AMB  FU VISIT   Pt here for fu with multiple complaints    Co  DOE  Pt has had  Pressure in chest  At times  Symptoms last  30 min when they come on    Pt states  Bra feels tight   No jaw pain  No diaphoresis      co pain in knees and legs     seen by  Ortho Dr Cecilie Lowers   no surgery  no inj     on celebrex  200 bid  some improvement   stiffness is  worse   hard to get around    FU  back pain and radiculopathy patient is going to see neurosurgeon in [place] and basically was told with her physical therapy she did and It did not make any difference  Offered to refer        Fu  Vericose veins   complains of leg pain undescribable feeling at times and concerned about her varicose veins   Pt went to see Dr Harvie Bridge  He wants to do procedure but  Pt canceled it      Follow-up GI issues patient was off metformin was doing some better  she is back on medication will need to monitor      FU TYPE 2 DM ON METFORMIN THAN HAD GI ISSUES  WAS OFF  AND TRIED BACK ON now   But only  500 mg one daily        INJ DISCUSSED  DUE TO  NEED FOR  CONTROL OF  BS  AND   WT LOSS NEED     TRIAL OF  TRULICITY  OZEMPIC  PT  FAILED   CO yeast with farxiga jardiance and lastly  Januvia   I reminded her that  High BS can cause  Yeast as well.    Will fu aic  This time and if elevated will have to  Either increase  Metformin or add another agent   254 BS at 3pm  And ate in am    Pt has not been checking  BS      FU HX of  COVID   PT DID HAVE  RASH AND ANXIETY  AFTERWARDS BUT FEELS ALL BETTER   SHE NEVER NEEDED THE  ATIVAN          FU LOW VIT D  ON SUPPLEMENT    FU NASH       [place]  + FATTY LIVER  DZ   REVIEWED WITH PT       FU WT GAIN   AND METABOLIC SYNDROME X   DISCUSSED WT LOSS MEDS  IN THE PAST     FU  GERD  PT  ON  PEPCID AND PRILOSEC    FU  HTN BP  HAS BEEN STABLE  AT  HOME       + cough on ACE   now on  BENICAR  and doing better at home  Today is elevated     No ha  No vision  Changes       Pt had ablation   no menses  and does get hot flashes      co pain in knees and legs     seen by  Ortho Dr Cecilie Lowers   no surgery  no inj     on celebrex  200 bid  some improvement   stiffness is  worse   hard to get around  REVIEW OF SYSTEMS:   Review of Systems   Respiratory: Positive for shortness of breath.    Cardiovascular:        DOE  And cp with exertion   General: No fever.  No chills.  Wt gain  HEENT: No vision changes.  Cardiac:  No palpitations.  No dizziness.  No light-headedness.  No near syncope.  Resp: No dyspnea at rest, ; no cough or hemoptysis; no orthopnea or PND.  GI: No N/V. No melena.  No bright red blood per rectum.  Ext: + edema.  No claudication.  Neuro: No focal weakness.  No numbness.  All other ROS negative.      Objective: BP (!) 130/92 (Site: Left, Patient Position: Sitting)   Pulse 84   Temp 37.1 C (98.8 F)   Ht 1.651 m (5\' 5" )   Wt 111 kg (244 lb)   SpO2 94%   BMI 40.60 kg/m              PHYSICAL EXAM  Physical Exam  Gen: NAD. Alert. OBESE  Patch  of Dry skin  Rash nonspecific  On  Right  flank  HEENT: PERRL; conjuntiva clear. No JVD or carotid bruit.  Cardiac: RRR with normal S1, S2.   Lungs: Clear to auscultation bilaterally. No rales. No wheezing. No rhonci.  Abdomen: Soft, non-tender.non-distended  nl bowel sounds  + ventral hernia   Extremities: MINIMALedema. No cyanosis. No clubbing.+ VERICOSE VEINS  Neurologic:  Grossly intact  Assessment/Plan  Assessment/Plan   1. Essential hypertension    2. Type 2 diabetes mellitus without complication, without long-term current use of insulin (CMS HCC)    3. Acquired hypothyroidism    4. Mixed hyperlipidemia    5. Vitamin D deficiency    6. SOB (shortness of breath) on exertion    7. Screening mammogram for breast cancer      Pt is on  Metformin   500 mg  X 1 mo           Januvia  jardiance farxiga  ozempic nausea   Pt says either yeast or  Urinary tract  Infection     Will check aic  First      Meds reviewed as well as labs.  Chart reviewed and updated.   Continue current treatment.  Keep follow-up appointment.   Vaccine hx reviewed.   Family hx of  Heart disease , dm, HTN, hld having   Exertional sob  Referral  Dr Renda Rollsjaved for eval    To have sclerotherapy   EBLA  Dr  Harvie BridgeArvon date  Unknown    triamcinalone  For eczema type rash on  Right flank area  May want to see surgeon   Re ventral hernia     In  Future  She does not want referral now   Mammogram April 06 2021  Cr   Order and to get scheduled

## 2022-01-23 LAB — COMPREHENSIVE METABOLIC PANEL, NON-FASTING
ALBUMIN/GLOBULIN RATIO: 1.2 (ref 0.8–1.4)
ALBUMIN: 3.8 g/dL (ref 3.5–5.7)
ALKALINE PHOSPHATASE: 72 U/L (ref 34–104)
ALT (SGPT): 27 U/L (ref 7–52)
ANION GAP: 7 mmol/L — ABNORMAL LOW (ref 10–20)
AST (SGOT): 32 U/L (ref 13–39)
BILIRUBIN TOTAL: 0.2 mg/dL — ABNORMAL LOW (ref 0.3–1.2)
BUN/CREA RATIO: 11 (ref 6–22)
BUN: 8 mg/dL (ref 7–25)
CALCIUM, CORRECTED: 9.1 mg/dL (ref 8.9–10.8)
CALCIUM: 8.9 mg/dL (ref 8.6–10.3)
CHLORIDE: 101 mmol/L (ref 98–107)
CO2 TOTAL: 27 mmol/L (ref 21–31)
CREATININE: 0.75 mg/dL (ref 0.60–1.30)
ESTIMATED GFR: 99 mL/min/{1.73_m2} (ref >59)
GLOBULIN: 3.3 (ref 2.9–5.4)
GLUCOSE: 150 mg/dL — ABNORMAL HIGH (ref 74–109)
OSMOLALITY, CALCULATED: 271 mOsm/kg (ref 270–290)
POTASSIUM: 4 mmol/L (ref 3.5–5.1)
PROTEIN TOTAL: 7.1 g/dL (ref 6.4–8.9)
SODIUM: 135 mmol/L — ABNORMAL LOW (ref 136–145)

## 2022-01-23 LAB — CBC WITH DIFF
BASOPHIL #: 0.1 10*3/uL (ref 0.00–2.50)
BASOPHIL %: 1 % (ref 0–3)
EOSINOPHIL #: 0.6 10*3/uL (ref 0.00–2.40)
EOSINOPHIL %: 7 % (ref 0–7)
HCT: 36.7 % — ABNORMAL LOW (ref 37.0–47.0)
HGB: 12.8 g/dL (ref 12.5–16.0)
LYMPHOCYTE #: 2 10*3/uL — ABNORMAL LOW (ref 2.10–11.00)
LYMPHOCYTE %: 26 % (ref 25–45)
MCH: 29.6 pg (ref 27.0–32.0)
MCHC: 34.8 g/dL (ref 32.0–36.0)
MCV: 85.1 fL (ref 78.0–99.0)
MONOCYTE #: 0.5 10*3/uL (ref 0.00–4.10)
MONOCYTE %: 7 % (ref 0–12)
MPV: 9 fL (ref 7.4–10.4)
NEUTROPHIL #: 4.6 10*3/uL (ref 4.10–29.00)
NEUTROPHIL %: 59 % (ref 40–76)
PLATELETS: 289 10*3/uL (ref 140–440)
RBC: 4.31 10*6/uL (ref 4.20–5.40)
RDW: 14.1 % (ref 11.6–14.8)
WBC: 7.8 10*3/uL (ref 4.0–10.5)
WBCS UNCORRECTED: 7.8 10*3/uL

## 2022-01-23 LAB — LIPID PANEL
CHOL/HDL RATIO: 3.3
CHOLESTEROL: 140 mg/dL (ref 136–290)
HDL CHOL: 43 mg/dL (ref 23–92)
LDL CALC: 45 mg/dL (ref 0–100)
TRIGLYCERIDES: 260 mg/dL — ABNORMAL HIGH (ref <=150)
VLDL CALC: 52 mg/dL — ABNORMAL HIGH (ref 0–50)

## 2022-01-23 LAB — HGA1C (HEMOGLOBIN A1C WITH EST AVG GLUCOSE): HEMOGLOBIN A1C: 7.4 % — ABNORMAL HIGH (ref 4.0–6.0)

## 2022-01-23 LAB — VITAMIN D 25 TOTAL: VITAMIN D: 42 ng/mL (ref 30–100)

## 2022-01-23 LAB — THYROID STIMULATING HORMONE (SENSITIVE TSH): TSH: 3.691 u[IU]/mL (ref 0.450–5.330)

## 2022-01-26 ENCOUNTER — Other Ambulatory Visit: Payer: Self-pay

## 2022-02-27 ENCOUNTER — Other Ambulatory Visit (RURAL_HEALTH_CENTER): Payer: Self-pay | Admitting: Internal Medicine

## 2022-03-27 ENCOUNTER — Other Ambulatory Visit (RURAL_HEALTH_CENTER): Payer: Self-pay | Admitting: Internal Medicine

## 2022-04-10 ENCOUNTER — Ambulatory Visit: Payer: 59 | Attending: INTERNAL MEDICINE

## 2022-04-10 ENCOUNTER — Other Ambulatory Visit: Payer: Self-pay

## 2022-04-10 DIAGNOSIS — R0602 Shortness of breath: Secondary | ICD-10-CM | POA: Insufficient documentation

## 2022-04-10 DIAGNOSIS — I208 Other forms of angina pectoris: Secondary | ICD-10-CM | POA: Insufficient documentation

## 2022-04-10 LAB — NT-PROBNP: NT-PROBNP: 157 pg/mL — ABNORMAL HIGH (ref <=125)

## 2022-04-10 LAB — TROPONIN-I: TROPONIN I: <2 ng/L (ref <15)

## 2022-04-23 ENCOUNTER — Other Ambulatory Visit (INDEPENDENT_AMBULATORY_CARE_PROVIDER_SITE_OTHER): Payer: Self-pay | Admitting: Internal Medicine

## 2022-05-29 ENCOUNTER — Telehealth (INDEPENDENT_AMBULATORY_CARE_PROVIDER_SITE_OTHER): Payer: Self-pay | Admitting: Internal Medicine

## 2022-05-29 IMAGING — MG 3D SCREENING MAMMO BIL AND TOMO
5 series · 8 of 24 positions shown · non-contrast
Comparison: 10/11/2021

------------- REPORT [id number] -------------
﻿

DOROTEO, CIELITHO
EXAM:  3D BILATERAL ANNUAL SCREENING DIGITAL MAMMOGRAM WITH TOMOSYNTHESIS
INDICATION: Screening.

[L]
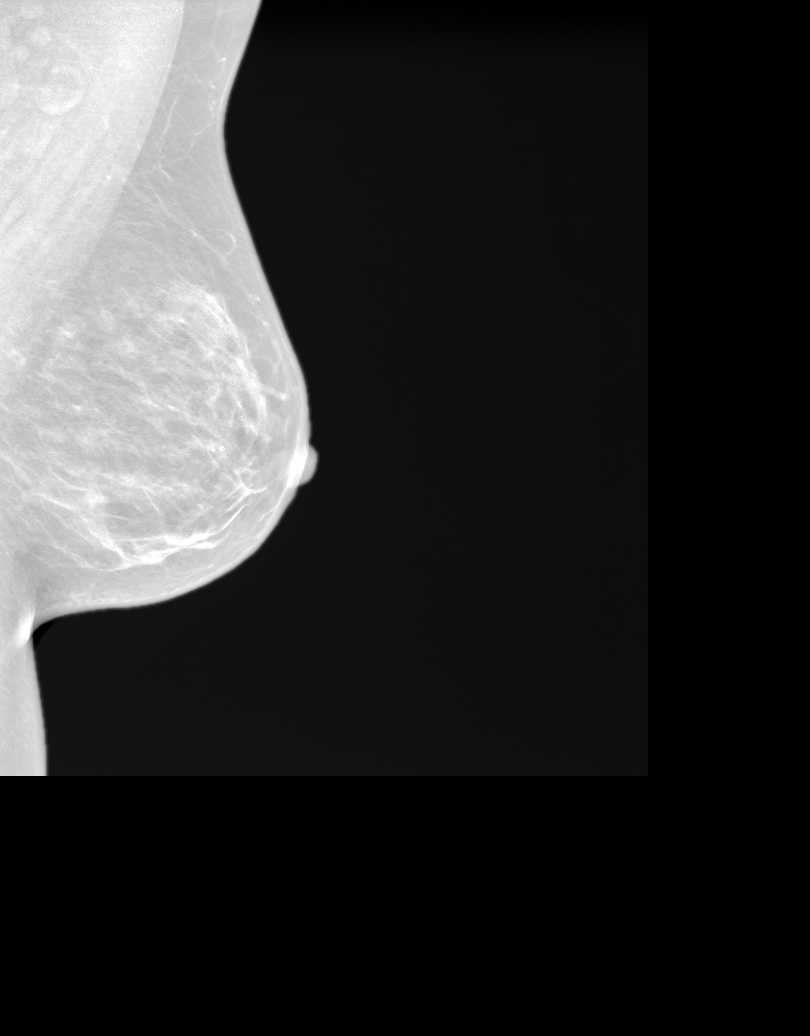

[R]
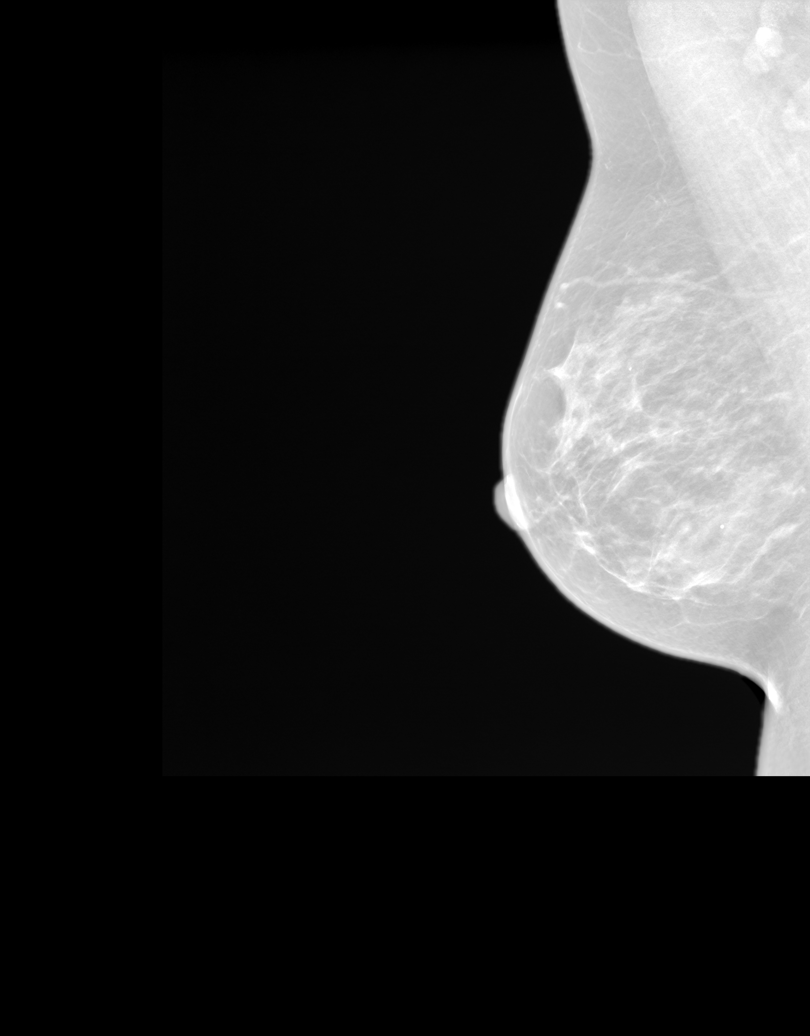

[R CC tomo · right · 0.10mm/px · 2 of 3 slices shown]
[im 1/3]
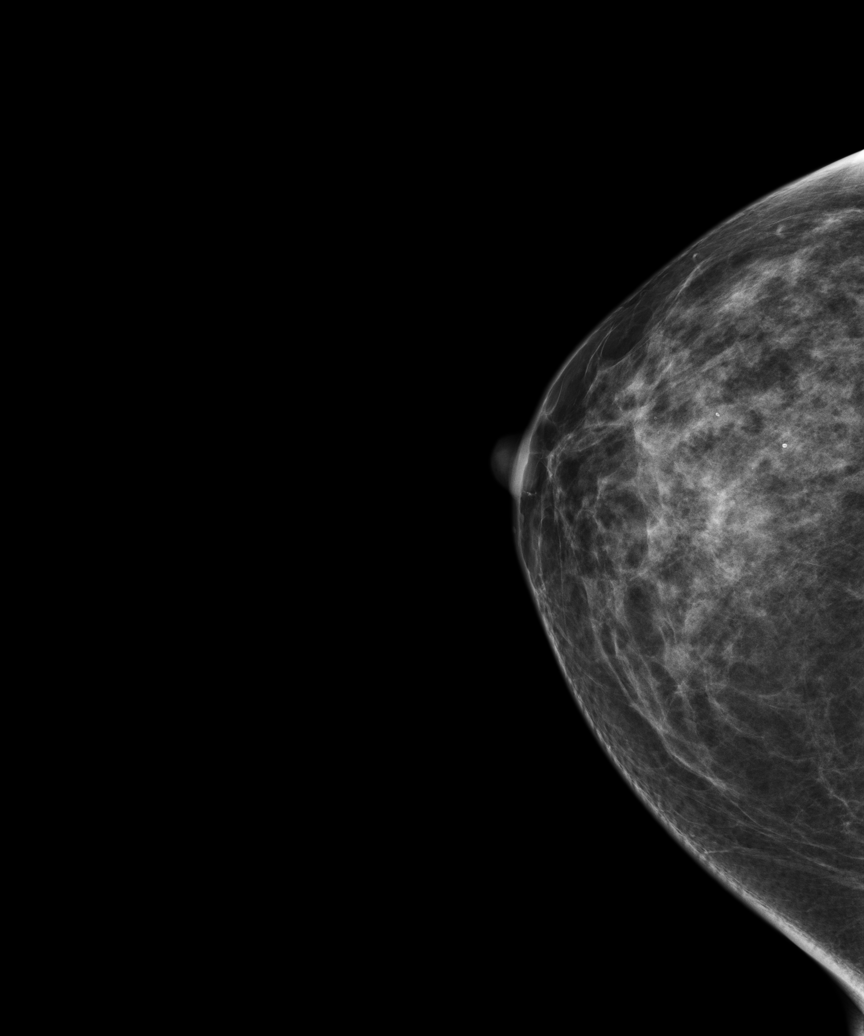
[im 3/3]
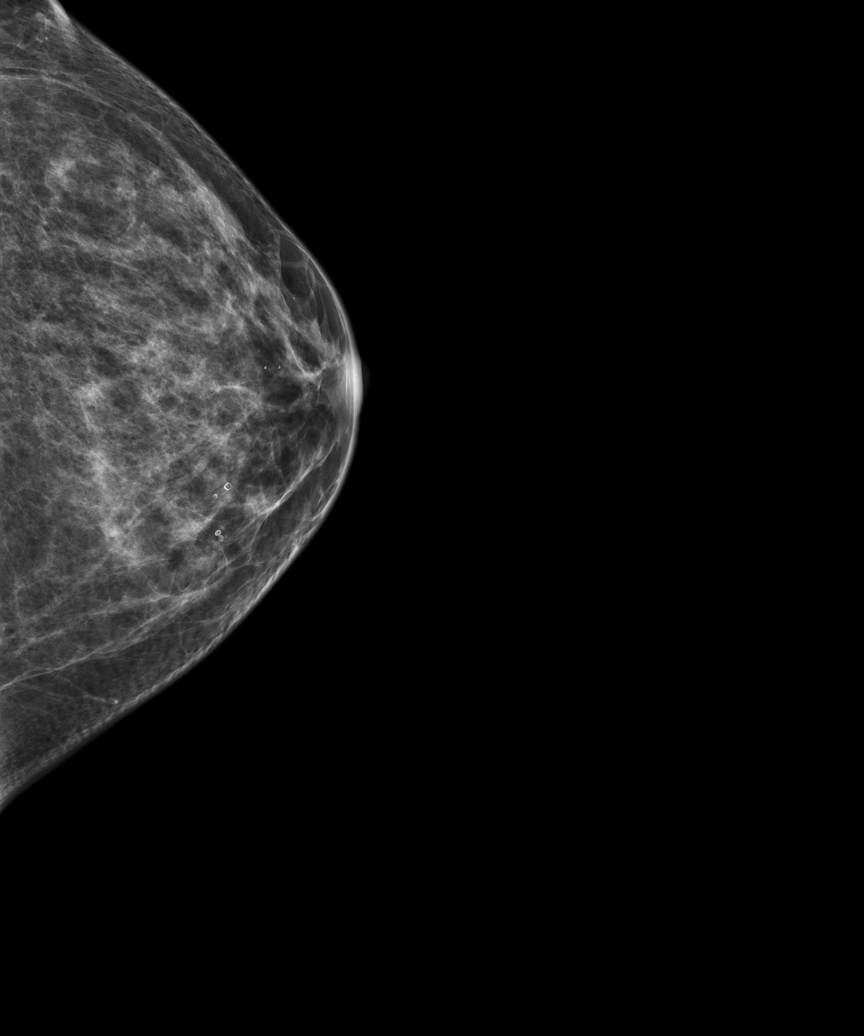

[3D SCREENING MAMMO BIL AND TOMO tomo · 2 acquisitions, 3 frames shown (1 of 2)]
[im 1/2]
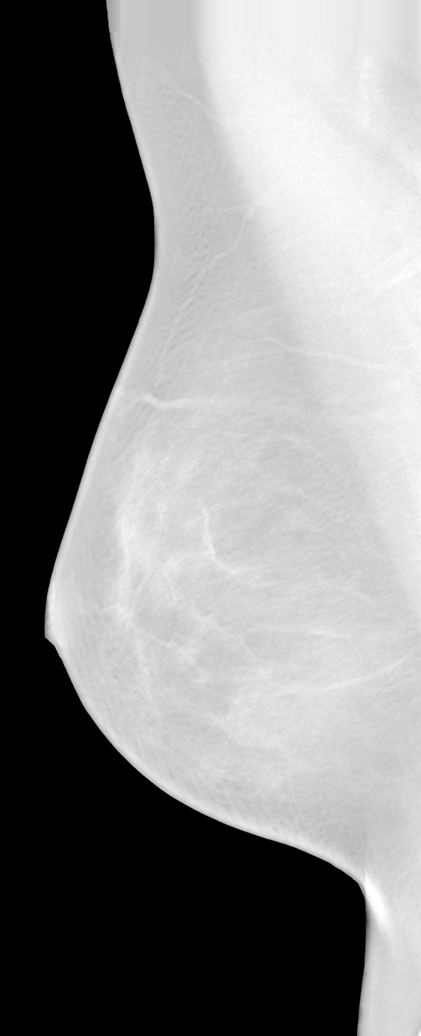
[im 2/2]
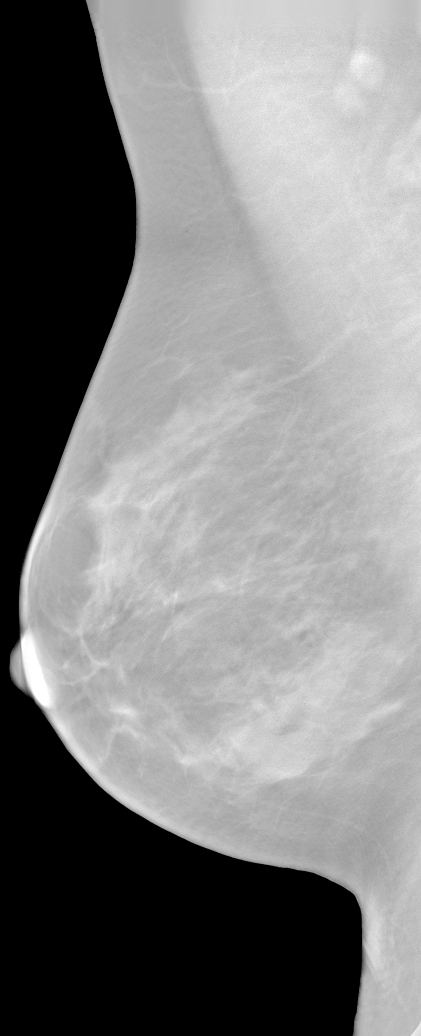
[im 2/2]
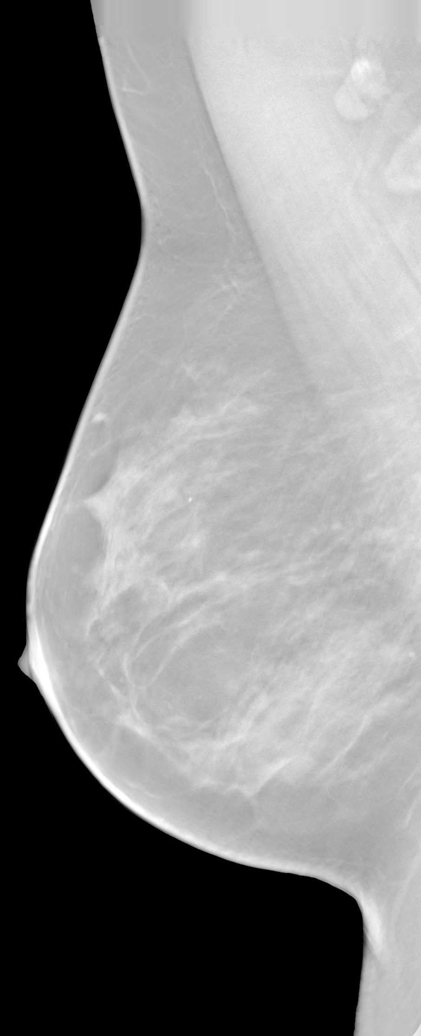

[3D SCREENING MAMMO BIL AND TOMO tomo (2 of 2) · tomo slice 10/62.0]
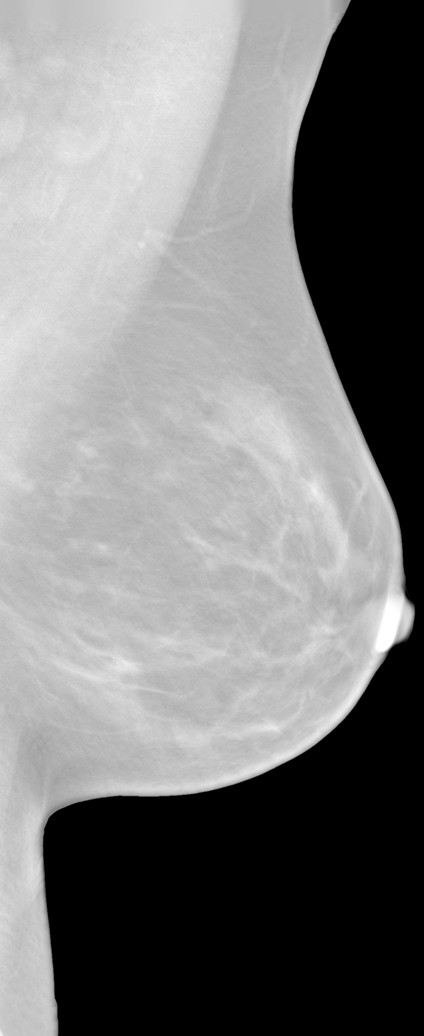

[8 of 24 positions shown; findings below may reference images not displayed]

FINDINGS: Breast parenchyma is heterogeneously dense.  There is no mass or suspicious cluster of microcalcifications.  There is no architectural distortion, skin thickening or nipple retraction.
IMPRESSION: 1.  BIRADS 2-Benign findings. Patient has been added in a reminder system with a target date for the next screening mammography.

2.  DENSITY CODE – C (Heterogeneously dense)  

Final Assessment Code:

Bi-Rads 2 

BI-RADS 0
 Need additional imaging evaluation.

BI-RADS 1
 Negative mammogram.

BI-RADS 2
 Benign finding.

BI-RADS 3
 Probably benign finding; short-interval follow-up suggested.

BI-RADS 4
 Suspicious abnormality; biopsy should be considered.

BI-RADS 5
 Highly suggestive of malignancy; appropriate action should be taken.

BI-RADS 6
 Known biopsy-proven malignancy; appropriate action should be taken.

NOTE:
In compliance with Federal regulations, the results of this mammogram are being sent to the patient.

------------- REPORT [id number] -------------
[facility]
[address]
Tiger Ms.KOCI, GALO PATRICIO:
We wish to report the following on your recent mammography examination. We are sending a report to your referring physician or other health care provider. 
(       Normal/Negative:
No evidence of cancer.
This statement is mandated by the [place], Department of Health.
Your examination was performed by one of our technologists, who are registered radiological technologists and also specially certified in mammography:
___
Markland, Marjuan (M)

Your mammogram was interpreted by our radiologist.

( 
Collette Sedman, M.D.

(Annual Breast Examination by a physician or other health care provider
(Annual Mammography Screening beginning at age 40
(Monthly Breast Self Examination

## 2022-06-04 ENCOUNTER — Telehealth (INDEPENDENT_AMBULATORY_CARE_PROVIDER_SITE_OTHER): Payer: Self-pay | Admitting: Internal Medicine

## 2022-06-05 ENCOUNTER — Ambulatory Visit
Admission: RE | Admit: 2022-06-05 | Discharge: 2022-06-05 | Disposition: A | Discharge status: Home / Self Care | Payer: 59 | Attending: INTERNAL MEDICINE | Admitting: INTERNAL MEDICINE

## 2022-06-05 ENCOUNTER — Ambulatory Visit (HOSPITAL_BASED_OUTPATIENT_CLINIC_OR_DEPARTMENT_OTHER): Payer: 59

## 2022-06-05 ENCOUNTER — Encounter (HOSPITAL_COMMUNITY): Payer: Self-pay | Admitting: INTERNAL MEDICINE

## 2022-06-05 ENCOUNTER — Encounter (HOSPITAL_COMMUNITY)
Admission: RE | Disposition: A | Discharge status: Home / Self Care | Payer: Self-pay | Source: Home / Self Care | Attending: INTERNAL MEDICINE

## 2022-06-05 ENCOUNTER — Other Ambulatory Visit: Payer: Self-pay

## 2022-06-05 DIAGNOSIS — R079 Chest pain, unspecified: Secondary | ICD-10-CM

## 2022-06-05 DIAGNOSIS — I208 Other forms of angina pectoris: Secondary | ICD-10-CM

## 2022-06-05 DIAGNOSIS — I25118 Atherosclerotic heart disease of native coronary artery with other forms of angina pectoris: Secondary | ICD-10-CM | POA: Insufficient documentation

## 2022-06-05 DIAGNOSIS — I10 Essential (primary) hypertension: Secondary | ICD-10-CM | POA: Insufficient documentation

## 2022-06-05 LAB — CBC
HCT: 39.5 % (ref 37.0–47.0)
HGB: 13.3 g/dL (ref 12.5–16.0)
MCH: 28.2 pg (ref 27.0–32.0)
MCHC: 33.6 g/dL (ref 32.0–36.0)
MCV: 83.7 fL (ref 78.0–99.0)
MPV: 7.6 fL (ref 7.4–10.4)
PLATELETS: 311 10*3/uL (ref 140–440)
RBC: 4.72 10*6/uL (ref 4.20–5.40)
RDW: 13.6 % (ref 11.6–14.8)
WBC: 6.6 10*3/uL (ref 4.0–10.5)
WBCS UNCORRECTED: 6.6 10*3/uL

## 2022-06-05 LAB — HCG, SERUM QUALITATIVE, PREGNANCY: PREGNANCY, SERUM QUALITATIVE: NEGATIVE

## 2022-06-05 LAB — BASIC METABOLIC PANEL
ANION GAP: 8 mmol/L — ABNORMAL LOW (ref 10–20)
BUN/CREA RATIO: 9 (ref 6–22)
BUN: 7 mg/dL (ref 7–25)
CALCIUM: 9.2 mg/dL (ref 8.6–10.3)
CHLORIDE: 99 mmol/L (ref 98–107)
CO2 TOTAL: 27 mmol/L (ref 21–31)
CREATININE: 0.79 mg/dL (ref 0.60–1.30)
ESTIMATED GFR: 93 mL/min/{1.73_m2} (ref >59)
GLUCOSE: 159 mg/dL — ABNORMAL HIGH (ref 74–109)
OSMOLALITY, CALCULATED: 270 mOsm/kg (ref 270–290)
POTASSIUM: 3.2 mmol/L — ABNORMAL LOW (ref 3.5–5.1)
SODIUM: 134 mmol/L — ABNORMAL LOW (ref 136–145)

## 2022-06-05 LAB — POC ACT CELITE (RESULTS)
ACT CELITE, POC: 351 seconds
ACT CELITE, POC: 197 seconds
ACT CELITE, POC: 185 seconds

## 2022-06-05 LAB — PT/INR
INR: 1.06 (ref <=5.00)
PROTHROMBIN TIME: 12.3 seconds (ref 9.8–12.7)

## 2022-06-05 LAB — MAGNESIUM: MAGNESIUM: 1.9 mg/dL (ref 1.9–2.7)

## 2022-06-05 LAB — PTT (PARTIAL THROMBOPLASTIN TIME): APTT: 33.3 seconds (ref 26.0–36.0)

## 2022-06-05 SURGERY — CORONARY ANGIOGRAPHY W/LEFT HEART CATH W/WO LVG
Anesthesia: Moderate Sedation

## 2022-06-05 MED ORDER — MIDAZOLAM 5 MG/ML INJECTION WRAPPER
INTRAMUSCULAR | Status: AC
Start: 2022-06-05 — End: 2022-06-05
  Filled 2022-06-05: qty 1

## 2022-06-05 MED ORDER — HEPARIN (PORCINE) 1,000 UNIT/ML INJECTION SOLUTION
Freq: Once | INTRAMUSCULAR | Status: DC | PRN
Start: 2022-06-05 — End: 2022-06-05
  Administered 2022-06-05: 7600 [IU] via INTRAVENOUS

## 2022-06-05 MED ORDER — SODIUM CHLORIDE 0.9 % INTRAVENOUS SOLUTION
INTRAVENOUS | Status: AC
Start: 2022-06-05 — End: 2022-06-05

## 2022-06-05 MED ORDER — IOPAMIDOL 370 MG IODINE/ML (76 %) INTRAVENOUS SOLUTION
Freq: Once | INTRAVENOUS | Status: DC | PRN
Start: 2022-06-05 — End: 2022-06-05
  Administered 2022-06-05: 60 mL via INTRA_ARTERIAL

## 2022-06-05 MED ORDER — MIDAZOLAM 5 MG/ML INJECTION WRAPPER
Freq: Once | INTRAMUSCULAR | Status: DC | PRN
Start: 2022-06-05 — End: 2022-06-05
  Administered 2022-06-05 (×2): 1 mg via INTRAVENOUS

## 2022-06-05 MED ORDER — SODIUM CHLORIDE 0.9 % INTRAVENOUS SOLUTION
INTRAVENOUS | Status: DC
Start: 2022-06-05 — End: 2022-06-05
  Administered 2022-06-05 (×2): 0 mL via INTRAVENOUS

## 2022-06-05 MED ORDER — DIPHENHYDRAMINE 25 MG CAPSULE
ORAL_CAPSULE | ORAL | Status: AC
Start: 2022-06-05 — End: 2022-06-05
  Filled 2022-06-05: qty 1

## 2022-06-05 MED ORDER — DIAZEPAM 5 MG TABLET
ORAL_TABLET | ORAL | Status: AC
Start: 2022-06-05 — End: 2022-06-05
  Filled 2022-06-05: qty 1

## 2022-06-05 MED ORDER — ATROPINE 0.1 MG/ML INJECTION SYRINGE
INJECTION | INTRAMUSCULAR | Status: AC
Start: 2022-06-05 — End: 2022-06-05
  Filled 2022-06-05: qty 10

## 2022-06-05 MED ORDER — FENTANYL (PF) 50 MCG/ML INJECTION SOLUTION
INTRAMUSCULAR | Status: AC
Start: 2022-06-05 — End: 2022-06-05
  Filled 2022-06-05: qty 2

## 2022-06-05 MED ORDER — DIPHENHYDRAMINE 25 MG CAPSULE
50.0000 mg | ORAL_CAPSULE | ORAL | Status: AC
Start: 2022-06-05 — End: 2022-06-05
  Administered 2022-06-05: 25 mg via ORAL

## 2022-06-05 MED ORDER — POTASSIUM CHLORIDE ER 20 MEQ TABLET,EXTENDED RELEASE(PART/CRYST)
40.0000 meq | ORAL_TABLET | ORAL | Status: DC
Start: 2022-06-05 — End: 2022-06-05
  Administered 2022-06-05: 80 meq via ORAL

## 2022-06-05 MED ORDER — ASPIRIN 81 MG CHEWABLE TABLET
324.0000 mg | CHEWABLE_TABLET | Freq: Once | ORAL | Status: AC
Start: 2022-06-05 — End: 2022-06-05
  Administered 2022-06-05: 324 mg via ORAL

## 2022-06-05 MED ORDER — ASPIRIN 81 MG CHEWABLE TABLET
CHEWABLE_TABLET | ORAL | Status: AC
Start: 2022-06-05 — End: 2022-06-05
  Filled 2022-06-05: qty 4

## 2022-06-05 MED ORDER — NITROGLYCERIN 0.4 MG SUBLINGUAL TABLET
SUBLINGUAL_TABLET | Freq: Once | SUBLINGUAL | Status: DC | PRN
Start: 2022-06-05 — End: 2022-06-05
  Administered 2022-06-05: .4 mg via SUBLINGUAL

## 2022-06-05 MED ORDER — POTASSIUM CHLORIDE ER 20 MEQ TABLET,EXTENDED RELEASE(PART/CRYST)
ORAL_TABLET | ORAL | Status: AC
Start: 2022-06-05 — End: 2022-06-05
  Filled 2022-06-05: qty 4

## 2022-06-05 MED ORDER — DIAZEPAM 5 MG TABLET
5.0000 mg | ORAL_TABLET | ORAL | Status: AC
Start: 2022-06-05 — End: 2022-06-05
  Administered 2022-06-05: 5 mg via ORAL

## 2022-06-05 MED ORDER — NITROGLYCERIN 0.4 MG SUBLINGUAL TABLET
SUBLINGUAL_TABLET | SUBLINGUAL | Status: AC
Start: 2022-06-05 — End: 2022-06-05
  Filled 2022-06-05: qty 1

## 2022-06-05 MED ORDER — FENTANYL (PF) 50 MCG/ML INJECTION WRAPPER
INJECTION | Freq: Once | INTRAMUSCULAR | Status: DC | PRN
Start: 2022-06-05 — End: 2022-06-05
  Administered 2022-06-05 (×2): 25 ug via INTRAVENOUS

## 2022-06-05 MED ORDER — IOPAMIDOL 370 MG IODINE/ML (76 %) INTRAVENOUS SOLUTION
Freq: Once | INTRAVENOUS | Status: DC | PRN
Start: 2022-06-05 — End: 2022-06-05
  Administered 2022-06-05: 40 mL via INTRA_ARTERIAL

## 2022-06-05 MED ORDER — SODIUM CHLORIDE 0.9 % INTRAVENOUS SOLUTION
INTRAVENOUS | Status: AC | PRN
Start: 2022-06-05 — End: 2022-06-05
  Administered 2022-06-05: 75 mL/h via INTRAVENOUS

## 2022-06-05 MED ORDER — LIDOCAINE HCL 10 MG/ML (1 %) INJECTION SOLUTION
INTRAMUSCULAR | Status: AC
Start: 2022-06-05 — End: 2022-06-05
  Filled 2022-06-05: qty 50

## 2022-06-05 MED ORDER — LIDOCAINE HCL 10 MG/ML (1 %) INJECTION SOLUTION
Freq: Once | INTRAMUSCULAR | Status: DC | PRN
Start: 2022-06-05 — End: 2022-06-05
  Administered 2022-06-05: 10 mL via INTRADERMAL

## 2022-06-05 MED ORDER — HEPARIN (PORCINE) (PF) 2,000 UNIT/1,000 ML IN 0.9 % SODIUM CHLORIDE IV
INTRAVENOUS | Status: AC
Start: 2022-06-05 — End: 2022-06-05
  Filled 2022-06-05: qty 2000

## 2022-06-05 SURGICAL SUPPLY — 26 items
CATH ANGIO 5FR JR4 CURVE 100CM PERFORMA RADOPQ BRD PERI PEBAX PLYCRB NYL STRL ACPT .038IN GW (CATHETERS) ×1 IMPLANT
CATH ANGIO 5FR JR4 CURVE 100CM_PRFM RADOPQ BRD PERI PEBAX (CATHETERS) ×2
CATH ANGIO 6FR 145D PGTL CURVE 110CM PERFORMA 5 SH RADOPQ (CATHETERS) ×2
CATH ANGIO 6FR 145D PGTL CURVE 110CM PERFORMA 5 SH RADOPQ BRD PERI CARDIAC PEBAX PLYCRB NYL STRL (CATHETERS) ×1 IMPLANT
CATH ANGIO 6FR JL3 CURVE 100CM ST+ RADOPQ BRD SLCT COR FEM (CATHETERS) ×2
CATH ANGIO 6FR JL3 CURVE 100CM ST+ RADOPQ BRD SLCT COR FEM POLYUR STRL LF  ACPT .038IN GW (CATHETERS) ×1 IMPLANT
CATH ANGIO 6FR JL3.5 CURVE 100CM PERFORMA RADOPQ BRD PERI (CATHETERS) ×2
CATH ANGIO 6FR JL3.5 CURVE 100CM PERFORMA RADOPQ BRD PERI PEBAX PLYCRB NYL STRL ACPT .038IN GW (CATHETERS) ×1 IMPLANT
CATH ANGIO 6FR JL4 CURVE 100CM PERFORMA RADOPQ BRD PERI (CATHETERS) ×2
CATH ANGIO 6FR JL4 CURVE 100CM PERFORMA RADOPQ BRD PERI PEBAX PLYCRB NYL STRL ACPT .038IN GW (CATHETERS) ×1 IMPLANT
CATH GD 6FR 100CM EBU3 CURVE SHR NX BAL SFT TIP DIST SLEEVE (CATHETERS) ×2
CATH GD 6FR 100CM EBU3 CURVE SHR NX BAL SFT TIP DIST SLEEVE FLXB DIST SEG COR HDPE STRL (CATHETERS) ×1 IMPLANT
CATH US EE PLAT 3.3-2.9FR 20MM 150CM 24CM 20MHZ RX LUM RADOPQ TAPER TIP GLYDX ACPT .014IN GW TRKBK (CATHETERS) ×1 IMPLANT
CATH US EE PLAT 3.3-2.9FR 20MM_150CM 24CM 20MHZ RX LUM (CATHETERS) ×2
GW INQWR .035IN 150CM FIX COR FINGER STRG PTFE VAS 3MM RAD STD J STRL LF (WIRE) ×1 IMPLANT
GW INQWR .035IN 150CM FIX COR_NGR STRG STD SHFT PTFE VAS 3 (WIRE) ×2
GW RUNTHROUGH .36MM 3CM 180CM RADOPQ X FLPY VAS STRL LF  DISP PTCA (WIRE) ×1 IMPLANT
GW RUNTHROUGH NS .014IN 3CM 18_CM ATRM X FLPY TIP RADOPQ (WIRE) ×2
KIT REMOVAL DEROYAL SUT S LTX-FREE DISP ST REMVL KT (WOUND CARE SUPPLY) ×1 IMPLANT
KIT REMOVAL DEROYAL SUT S LTX-FREE DISP ST REMVL KT (WOUND CARE/ENTEROSTOMAL SUPPLY) ×1
SET ADMN MERIT MEDICAL SYS INJ MED CNRST S DISP (VASCULAR) ×2 IMPLANT
SHEATH INTROD 10CM 6FR PINN COR SS HDRPH PTFE SNAPON DIL LOCK KINK RST SMOOTH TRNS 2.5CM ACPT .038IN (INTRODUCER) ×1 IMPLANT
SHEATH SUPER 6FR 11CM .035_RSS602 10EA/BX (INTRODUCER) ×2
SUTURE SILK 2-0 X-1 PERMAHAND 18IN BLK BRD NONAB (SUTURE/WOUND CLOSURE) ×2 IMPLANT
VALVE HMSTS CLR 7.3FR PHD METAL PLYCRB SIL INSERTION TOOL (CARDIAC) ×2
VALVE HMSTS CLR 7.3FR PHD METAL PLYCRB SIL INSERTION TOOL TORQUE DEV COMPRESS SEAL PUSH RELEASE MECH (CARDIAC) ×1 IMPLANT

## 2022-06-05 NOTE — Interval H&P Note (Signed)
[facility]      H&P UPDATE FORM                                                                                  Alicja, Everitt, 46 y.o. female  Date of Admission:  06/05/2022  Date of Birth:  1975-12-11    06/05/2022    STOP: IF H&P IS GREATER THAN 30 DAYS FROM SURGICAL DAY COMPLETE NEW H&P IS REQUIRED.     H & P updated the day of the procedure.  1.  H&P completed within 30 days of surgical procedure and has been reviewed within 24 hours of admission but prior to surgery or a procedure requiring anesthesia services, the patient has been examined, and no change has occured in the patients condition since the H&P was completed.       Change in medications: No        No LMP recorded.      Comments:     2.  Patient continues to be appropriate candidate for planned surgical procedure. YES    Verta Ellen, MD

## 2022-06-05 NOTE — Nurses Notes (Signed)
ACT 197

## 2022-06-05 NOTE — Sedation Documentation (Signed)
06/05/22    Procedure:  Coronary angiogram and left heart catheterization    Diagnosis: Unstable angina and abnormal stress test    Sedation Informed Consent, pre-sedation risk assessment and evaluation completed.  History of previous adverse experiences with sedation/analgesia assessed.  Monitored conscious sedation was administered under my direct supervision by an appropriately trained sedation nurse.  Appropriate Facility and equipment compliant.    Patient was given 2 mg of Versed and 50 mg fentanyl IV at 8:33 AM and procedure completed at 9:04 AM.  No complications.  Hemodynamically stable at the end of the procedure.                  Staff:  TEE/Cardiovascular    No data selected in time range         Sedation and Procedure Times:                       Aldrete Scores    Pre Sedation  @FLOW :100307:last@  Respiration: 2-->able to breathe and cough freely  Circulation: 2-->BP within 20% of pre-anesthetic level  Consciousness: 2-->fully awake  O2 Saturation: 2-->able to maintain O2 saturation greater than 92% on room air  Dressing: 2-->dry and clean or not applicable  Pain: 2-->pain free  Ambulation: 2-->able to stand up and walk straight, on ordered bedrest, or performing at previous level of functioning  Fasting/Feeding: 2-->able to drink fluids or NPO, minimal nausea/ no vomiting  Urine Output: 2-->has voided, adequate urine output per device, or not applicable  Modified Aldrete Score: 20    Post Sedation  Assessment Scored:: Post-Procedure  Activity: 2-->able to move 4 extremities voluntarily or on command  Respiration: 2-->able to breathe and cough freely  Circulation: 2-->BP within 20% of pre-anesthetic level  Consciousness: 2-->fully awake  O2 Saturation: 2-->able to maintain O2 saturation greater than 92% on room air  Dressing: 2-->dry and clean or not applicable  Pain: 2-->pain free  Ambulation: 2-->able to stand up and walk straight, on ordered bedrest, or performing at previous level of  functioning  Fasting/Feeding: 2-->able to drink fluids or NPO, minimal nausea/ no vomiting  Urine Output: 2-->has voided, adequate urine output per device, or not applicable  Post Modified Aldrete Score: 20                                      [phone], MD

## 2022-06-05 NOTE — Nurses Notes (Signed)
Sue Rose pulled Rt femoral sheath, tolerated well, no s/s bleeding or hematoma , pulses to rt foot 2+.

## 2022-06-05 NOTE — Discharge Instructions (Addendum)
[facility] PRE/POST CARDIAC CATHETHETERIZATION INFORMATION  PROVIDED TO PATIENT. INFORMATION INCLUDING CORONARY STENT, HAVING CARDIAC CATHETRIZATION, DISCHARGE INSTRUCTIONS FOR CARDIAC CATHETERIZATION, DISCHARGE INSTRUCTIONS FOR CORONARY ANGIOPLASTY AND STENTING. UNDERSTANDING VERBALIZED.      PT PROVIDED WITH INFORMATION FOR POST IV CONTRAST MEDIA INSTRUCTIONS.  PT IS DIABETIC. PT IS ISTAKING METFORMIN CONTAINING MEDICATIONS, PT DOES HAVE A HISTORY OF HYPERTENSION. PT VERBALIZES UNDERSTANDING TO CONSUME PLENTY OF WATER TO FLUSH IV CONTRAST FROM KIDNEYS. (PT VERBALIZED UNDERSTANDING NOT TO TAKE METFORMIN FOR 48 HOURS POST PROCEDURE.  )    HOME MEDICATIONS REVIEWED WITH PT. ALL HOME MEDICATIONS AND DOSAGES VERIFIED WITH PT. PT AWARE NOT TO TAKE METFORMIN FOR 48 HOURS POST PROCEDURE.         POST CATH DISCAHRGE INSTRUCTIONS REVIEWED WITH PT. PT AWARE TO:     ~CHECK COLOR, TEMP, AND CIRCULATION OF RT LEG AT LEAST EVERY 8 HOURS     ~NO DRIVING, OPERATING HEAVY MACHINERY/EQUIPMENT OR POWER TOOLS FOR   5 DAYS    ~NOT TO MAKE IMPORTANT DECISIONS FOR 24 HOURS     ~IF BLEEDING, PERSISTENT NAUSEA/VOMITING UNUSUAL PAIN, SWELLING OR FEVER OCCUR, NOTIFY PHYSICIAN OR CALL 911     ~NO LIFTING ANYTHING OVER  10  POUNDS FOR  5 DAYS    ~MAY SHOWER IN 24 HOURS, DRESSING MAY BE REMOVED IN 24 HOURS     ~CLEAN SITE DAILY WITH MILD SOAP AND WATER TO DECREASE INFECTION RISK, KEEP SITE CLEAN AND DRY     ~NO LAWN MOWER, MOTORCYCLE, CHAINSAW OR ATV FOR 48 HOURS     ~DRINK AT LEAST 8-8 OUNCE GLASSES OF WATER A DAY FOR 24 HOURS      F/U APPT DR JAVED, KEEP APPOINTMENT FOR 06/13/2022        ALL INSTRUCTIONS REVIEWED WITH PT AND WIFE

## 2022-06-05 NOTE — H&P (Signed)
See scanned documents.

## 2022-06-10 ENCOUNTER — Ambulatory Visit: Payer: 59 | Attending: Internal Medicine | Admitting: Internal Medicine

## 2022-06-10 ENCOUNTER — Other Ambulatory Visit: Payer: Self-pay

## 2022-06-10 ENCOUNTER — Encounter (INDEPENDENT_AMBULATORY_CARE_PROVIDER_SITE_OTHER): Payer: Self-pay | Admitting: Internal Medicine

## 2022-06-10 VITALS — BP 118/80 | HR 91 | Temp 98.5°F | Ht 65.0 in | Wt 224.0 lb

## 2022-06-10 DIAGNOSIS — E782 Mixed hyperlipidemia: Secondary | ICD-10-CM | POA: Insufficient documentation

## 2022-06-10 DIAGNOSIS — M5116 Intervertebral disc disorders with radiculopathy, lumbar region: Secondary | ICD-10-CM | POA: Insufficient documentation

## 2022-06-10 DIAGNOSIS — R739 Hyperglycemia, unspecified: Secondary | ICD-10-CM | POA: Insufficient documentation

## 2022-06-10 DIAGNOSIS — K219 Gastro-esophageal reflux disease without esophagitis: Secondary | ICD-10-CM | POA: Insufficient documentation

## 2022-06-10 DIAGNOSIS — I10 Essential (primary) hypertension: Secondary | ICD-10-CM | POA: Insufficient documentation

## 2022-06-10 DIAGNOSIS — E876 Hypokalemia: Secondary | ICD-10-CM | POA: Insufficient documentation

## 2022-06-10 DIAGNOSIS — R0602 Shortness of breath: Secondary | ICD-10-CM | POA: Insufficient documentation

## 2022-06-10 DIAGNOSIS — E119 Type 2 diabetes mellitus without complications: Secondary | ICD-10-CM | POA: Insufficient documentation

## 2022-06-10 MED ORDER — CYCLOBENZAPRINE 10 MG TABLET
10.0000 mg | ORAL_TABLET | Freq: Three times a day (TID) | ORAL | 0 refills | Status: DC
Start: 2022-06-10 — End: 2022-11-20

## 2022-06-10 NOTE — Progress Notes (Signed)
Name: Sue Rose                       Date of Birth: [birth year]   MRN:  [id number]                         Date of visit: 06/10/2022     PCP: Samuella Cota, PA-C     Subjective  Sue Rose is a 46 y.o. year old female who presents for Follow Up 4 Months (Follow up  pain in back is bothering her she did have a heart cath/Wednesday dr Renda Rolls)   to clinic.  No specialty comments available.   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 01/21/2022    Symptomatic varicose veins 01/21/2022    Thyroid nodule 01/21/2022    Type 2 diabetes mellitus without complication (CMS HCC) 01/21/2022    Vitamin D deficiency 01/21/2022      Current Outpatient Medications   Medication Sig    aspirin 81 mg Oral Tablet, Chewable Chew 1 Tablet (81 mg total) Once a day    celecoxib (CELEBREX) 200 mg Oral Capsule TAKE ONE CAPSULE BY MOUTH TWICE DAILY    cetirizine (ZYRTEC) 10 mg Oral Tablet Take 1 Tablet (10 mg total) by mouth Once per day as needed for Allergies    cholecalciferol, vitamin D3, 1,250 mcg (50,000 unit) Oral Capsule Take 1,250 mcg by mouth Every 7 days (Patient not taking: Reported on 06/05/2022)    cyclobenzaprine (FLEXERIL) 10 mg Oral Tablet Take 1 Tablet (10 mg total) by mouth Three times a day    DULoxetine (CYMBALTA DR) 30 mg Oral Capsule, Delayed Release(E.C.) Take 1 Capsule (30 mg total) by mouth Once a day (Patient not taking: Reported on 01/22/2022)    ergocalciferol, vitamin D2,  (DRISDOL) 1,250 mcg (50,000 unit) Oral Capsule Take 1 Capsule (50,000 Units total) by mouth Every 7 days    famotidine (PEPCID) 40 mg Oral Tablet Take 1 Tablet (40 mg total) by mouth Twice daily    hydroCHLOROthiazide (HYDRODIURIL) 25 mg Oral Tablet TAKE ONE TABLET BY MOUTH EVERY MORNING (Patient taking differently: Take 1 Tablet (25 mg total) by mouth Once a day Pt taking half tablet)    levothyroxine (SYNTHROID) 50 mcg Oral Tablet Take 1 Tablet (50 mcg total) by mouth Every morning    metFORMIN (GLUCOPHAGE) 500 mg Oral Tablet Take 1 Tablet (500 mg total) by mouth Twice daily with food Patient only takes once a day    montelukast (SINGULAIR) 10 mg Oral Tablet Take 1 Tablet (10 mg total) by mouth Every evening (Patient not taking: Reported on 06/05/2022)    nitroGLYCERIN (NITROSTAT) 0.4 mg Sublingual Tablet, Sublingual Place 1 Tablet (0.4 mg total) under the tongue Every 5 minutes as needed for Chest pain for 3 doses over 15 minutes    olmesartan (BENICAR) 40 mg Oral Tablet Take 1 Tablet (40 mg total) by mouth Once a day    omeprazole (PRILOSEC) 40 mg Oral  Capsule, Delayed Release(E.C.) Take 1 Capsule (40 mg total) by mouth Once a day    rosuvastatin (CRESTOR) 5 mg Oral Tablet TAKE ONE TABLET BY MOUTH ONCE DAILY AS NEEDED FOR HYPERLIPIDEMIA (Patient taking differently: Take 1 Tablet (5 mg total) by mouth Once a day)    SITagliptin phosphate (JANUVIA) 50 mg Oral Tablet Take 1 Tablet (50 mg total) by mouth Once a day (Patient not taking: Reported on 01/22/2022)    triamcinolone acetonide (ARISTOCORT A) 0.1 % Cream Apply topically Twice daily (Patient not taking: Reported on 06/05/2022)        Chronic Disease Management-AMB  FU VISIT   Pt here for fu with multiple complaints    Co  DOE   Pt last visit co pain in chest and exertional sob  Pt was referred to DR Renda Rolls pt underwent  echo  stress test and s he just completed Heart Cath  Pt has fu this  week  Dr Melvyn Neth was brought in but no intervention   No new meds    Pt is to  ask Dr Renda Rolls for what they are telling me was abnormal ie small vessels if that could cause her SOB  Pt and I discussed  Pulmonology referral due to SOB  and she agrees  She feels it has been getting worse   Pt has had  covid       FU pain in  knees and legs     seen by  Ortho Dr Cecilie Lowers   no surgery  no inj     Pt ison celebrex  200 bid  some improvement   stiffness is  worse   hard to get around    FU  back pain and radiculopathy patient did  see neurosurgeon in [place] and basically was told nothing to do at this time  I told her going back with current symptoms may  help   Pt asked about  gabapentin  as she did not really try it when I rx in the past   I  told her that she has been losing wt  and I prefer her hold off on Neurontin  due to  possible symptoms     First to try  tylenol   tid        Fu  Vericose veins   complains of leg pain undescribable feeling at times and concerned about her varicose veins   Pt went to see Dr Harvie Bridge  He wants to do procedure but  Pt canceled it      Follow-up GI issues patient was off metformin was doing some better  she is back on medication will need to monitor      FU TYPE 2 DM ON METFORMIN THAN HAD GI ISSUES  WAS OFF  AND TRIED BACK ON now  But only  500 mg one daily        INJ DISCUSSED  DUE TO  NEED FOR  CONTROL OF  BS  AND   WT LOSS NEED     TRIAL OF  TRULICITY  OZEMPIC  PT  FAILED   CO yeast with farxiga jardiance and lastly  Januvia   I reminded her that  High BS can cause  Yeast as well.    Will fu aic  This time and if elevated will have to  Either increase  Metformin or add another agent   BS discussed      FU HX of  COVID   pt has  not felt well since having covid         FU LOW VIT D  ON SUPPLEMENT    FU NASH       [place]  + FATTY LIVER  DZ   REVIEWED WITH PT     FU WT GAIN   AND METABOLIC SYNDROME X   DISCUSSED WT LOSS MEDS  IN THE PAST     FU  GERD  PT  ON  PEPCID AND PRILOSEC    FU  HTN BP  HAS BEEN STABLE  AT  HOME       + cough on ACE   now on  BENICAR       Pt had ablation   no menses  and does get hot flashes            REVIEW OF SYSTEMS:   Review of Systems  General: No fever.  No chills.   weight loss due to diet changes.  HEENT: No vision changes.  Cardiac: No chest pain. No palpitations.  No dizziness.  No light-headedness.  No near syncope.  Resp: No dyspnea at rest, +dyspnea on exertion; no cough or hemoptysis; no orthopnea or PND.  GI: No N/V. No melena.  No bright red blood per rectum.  Ext: + edema.  On and off  No claudication.  Neuro: No focal weakness.  No numbness.  Psych  pt is anxious over feeling bad and worried as her breathing  is getting worse  with exertion  All other ROS negative.      Objective:   BP 118/80 (Site: Left, Patient Position: Sitting)   Pulse 91   Temp 36.9 C (98.5 F)   Ht 1.651 m (5\' 5" )   Wt 102 kg (224 lb)   SpO2 99%   BMI 37.28 kg/m              PHYSICAL EXAM  Physical Exam  Gen: NAD. Alert. obese  HEENT: PERRL; conjunctivae clear. No JVD or carotid bruit.  Cardiac: RRR with normal S1, S2.   Lungs: Clear to auscultation bilaterally. No rales. No wheezing. No rhonchi.  Abdomen: Soft, non-tender.non-distended  nl bowel sounds    Extremities: No edema. No cyanosis. No clubbing.  Neurologic:  Grossly intact  Stiffness with getting up  on table   Psych Pt upset easily  as she does not have answers to the sob   Assessment/Plan  Assessment/Plan   1. SOB (shortness of breath)    2. Essential hypertension    3. Gastroesophageal reflux disease without esophagitis    4. Hypokalemia    5. Hyperglycemia    6. Type 2 diabetes mellitus without complication, without long-term current use of insulin (CMS HCC)    7. Mixed hyperlipidemia    8. 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        Meds reviewed as well as labs.  Chart reviewed and updated.   Continue current treatment.  Keep follow-up appointment.   Vaccine hx reviewed.   Last aic  7.4  12/2021   BS discussed  fu  aic    06/05/22  K low at  3.2  at  hosp   mg   1.8  S/p  heart cath and reviewed report that is made available  Need Dr 08/05/22 report and final cath report   No intervention made no change in medications   pt has fu with Dr Melvyn Neth on Thursday   Severe pain  in  back worse since laying on table for cath     Trial of tylenol   tid for back first with or wo Celebrex   heat  on off   Flexoril prn    Stretches discussed  Offered to send back to back specialist  or PT b ut pt declined      Stay with HCTZ half tablet      To fu Dr Renda Rolls this week     Referral  to dr Truddie Crumble for eval sob       Need full cath report and Dr Melvyn Neth report    43mo fu

## 2022-06-11 LAB — COMPREHENSIVE METABOLIC PANEL, NON-FASTING
ALBUMIN/GLOBULIN RATIO: 1.1 (ref 0.8–1.4)
ALBUMIN: 4.3 g/dL (ref 3.5–5.7)
ALKALINE PHOSPHATASE: 83 U/L (ref 34–104)
ALT (SGPT): 40 U/L (ref 7–52)
ANION GAP: 9 mmol/L — ABNORMAL LOW (ref 10–20)
AST (SGOT): 43 U/L — ABNORMAL HIGH (ref 13–39)
BILIRUBIN TOTAL: 0.4 mg/dL (ref 0.3–1.2)
BUN/CREA RATIO: 13 (ref 6–22)
BUN: 10 mg/dL (ref 7–25)
CALCIUM, CORRECTED: 9.1 mg/dL (ref 8.9–10.8)
CALCIUM: 9.4 mg/dL (ref 8.6–10.3)
CHLORIDE: 99 mmol/L (ref 98–107)
CO2 TOTAL: 24 mmol/L (ref 21–31)
CREATININE: 0.77 mg/dL (ref 0.60–1.30)
ESTIMATED GFR: 96 mL/min/{1.73_m2} (ref >59)
GLOBULIN: 3.8 (ref 2.9–5.4)
GLUCOSE: 126 mg/dL — ABNORMAL HIGH (ref 74–109)
OSMOLALITY, CALCULATED: 265 mOsm/kg — ABNORMAL LOW (ref 270–290)
POTASSIUM: 3.8 mmol/L (ref 3.5–5.1)
PROTEIN TOTAL: 8.1 g/dL (ref 6.4–8.9)
SODIUM: 132 mmol/L — ABNORMAL LOW (ref 136–145)

## 2022-06-11 LAB — ECG 12 LEAD
Atrial Rate: 74 {beats}/min
Calculated P Axis: 67 degrees
Calculated R Axis: 62 degrees
Calculated T Axis: 63 degrees
PR Interval: 176 ms
QRS Duration: 88 ms
QT Interval: 428 ms
QTC Calculation: 475 ms
Ventricular rate: 74 {beats}/min

## 2022-06-11 LAB — HGA1C (HEMOGLOBIN A1C WITH EST AVG GLUCOSE): HEMOGLOBIN A1C: 7.2 % — ABNORMAL HIGH (ref 4.0–6.0)

## 2022-06-11 LAB — MAGNESIUM: MAGNESIUM: 2 mg/dL (ref 1.9–2.7)

## 2022-06-11 LAB — THYROID STIMULATING HORMONE (SENSITIVE TSH): TSH: 3.586 u[IU]/mL (ref 0.450–5.330)

## 2022-06-27 ENCOUNTER — Telehealth (INDEPENDENT_AMBULATORY_CARE_PROVIDER_SITE_OTHER): Payer: Self-pay | Admitting: Internal Medicine

## 2022-07-08 LAB — INVASIVE CARDIOLOGY PROCEDURE: Cath EF Quantitative: 60 %

## 2022-08-13 ENCOUNTER — Ambulatory Visit: Payer: 59 | Attending: Internal Medicine | Admitting: Internal Medicine

## 2022-08-13 ENCOUNTER — Encounter (INDEPENDENT_AMBULATORY_CARE_PROVIDER_SITE_OTHER): Payer: Self-pay | Admitting: Internal Medicine

## 2022-08-13 ENCOUNTER — Other Ambulatory Visit: Payer: Self-pay

## 2022-08-13 ENCOUNTER — Other Ambulatory Visit (INDEPENDENT_AMBULATORY_CARE_PROVIDER_SITE_OTHER): Payer: Self-pay | Admitting: Internal Medicine

## 2022-08-13 VITALS — BP 132/78 | HR 82 | Temp 97.8°F | Ht 65.0 in

## 2022-08-13 DIAGNOSIS — I10 Essential (primary) hypertension: Secondary | ICD-10-CM | POA: Insufficient documentation

## 2022-08-13 DIAGNOSIS — F411 Generalized anxiety disorder: Secondary | ICD-10-CM | POA: Insufficient documentation

## 2022-08-13 DIAGNOSIS — E039 Hypothyroidism, unspecified: Secondary | ICD-10-CM | POA: Insufficient documentation

## 2022-08-13 DIAGNOSIS — E669 Obesity, unspecified: Secondary | ICD-10-CM | POA: Insufficient documentation

## 2022-08-13 DIAGNOSIS — E782 Mixed hyperlipidemia: Secondary | ICD-10-CM | POA: Insufficient documentation

## 2022-08-13 DIAGNOSIS — M1990 Unspecified osteoarthritis, unspecified site: Secondary | ICD-10-CM | POA: Insufficient documentation

## 2022-08-13 DIAGNOSIS — E559 Vitamin D deficiency, unspecified: Secondary | ICD-10-CM | POA: Insufficient documentation

## 2022-08-13 DIAGNOSIS — E119 Type 2 diabetes mellitus without complications: Secondary | ICD-10-CM | POA: Insufficient documentation

## 2022-08-13 DIAGNOSIS — R350 Frequency of micturition: Secondary | ICD-10-CM | POA: Insufficient documentation

## 2022-08-13 DIAGNOSIS — K219 Gastro-esophageal reflux disease without esophagitis: Secondary | ICD-10-CM | POA: Insufficient documentation

## 2022-08-13 LAB — POCT URINE DIPSTICK
BILIRUBIN: NEGATIVE
BLOOD: NEGATIVE
GLUCOSE: NEGATIVE
KETONE: NEGATIVE
LEUKOCYTES: NEGATIVE
NITRITE: NEGATIVE
PH: 5.5
PROTEIN: NEGATIVE
SPECIFIC GRAVITY: 1.01
UROBILINOGEN: NEGATIVE

## 2022-08-13 MED ORDER — FAMOTIDINE 40 MG TABLET
40.0000 mg | ORAL_TABLET | Freq: Two times a day (BID) | ORAL | 3 refills | Status: DC
Start: 2022-08-13 — End: 2023-08-05

## 2022-08-13 MED ORDER — METFORMIN 500 MG TABLET
500.0000 mg | ORAL_TABLET | Freq: Two times a day (BID) | ORAL | 3 refills | Status: DC
Start: 2022-08-13 — End: 2022-11-20

## 2022-08-13 MED ORDER — CHOLECALCIFEROL (VITAMIN D3) 1,250 MCG (50,000 UNIT) CAPSULE
50000.0000 [IU] | ORAL_CAPSULE | ORAL | 3 refills | Status: DC
Start: 2022-08-13 — End: 2022-11-20

## 2022-08-13 MED ORDER — CETIRIZINE 10 MG TABLET
10.0000 mg | ORAL_TABLET | Freq: Every day | ORAL | 3 refills | Status: DC | PRN
Start: 2022-08-13 — End: 2022-11-20

## 2022-08-13 MED ORDER — HYDROCHLOROTHIAZIDE 25 MG TABLET
25.0000 mg | ORAL_TABLET | Freq: Every morning | ORAL | 2 refills | Status: DC
Start: 2022-08-13 — End: 2022-11-20

## 2022-08-13 MED ORDER — OMEPRAZOLE 40 MG CAPSULE,DELAYED RELEASE
40.0000 mg | DELAYED_RELEASE_CAPSULE | Freq: Every day | ORAL | 3 refills | Status: DC
Start: 2022-08-13 — End: 2023-08-05

## 2022-08-13 MED ORDER — OLMESARTAN 40 MG TABLET
40.0000 mg | ORAL_TABLET | Freq: Every day | ORAL | 3 refills | Status: DC
Start: 2022-08-13 — End: 2022-11-20

## 2022-09-05 ENCOUNTER — Other Ambulatory Visit (RURAL_HEALTH_CENTER): Payer: Self-pay | Admitting: Internal Medicine

## 2022-11-20 ENCOUNTER — Ambulatory Visit: Payer: 59 | Attending: Internal Medicine | Admitting: Internal Medicine

## 2022-11-20 ENCOUNTER — Encounter (INDEPENDENT_AMBULATORY_CARE_PROVIDER_SITE_OTHER): Payer: Self-pay | Admitting: Internal Medicine

## 2022-11-20 ENCOUNTER — Other Ambulatory Visit: Payer: Self-pay

## 2022-11-20 VITALS — BP 130/28 | HR 72 | Temp 98.0°F | Ht 65.0 in | Wt 215.0 lb

## 2022-11-20 DIAGNOSIS — I10 Essential (primary) hypertension: Secondary | ICD-10-CM

## 2022-11-20 DIAGNOSIS — E559 Vitamin D deficiency, unspecified: Secondary | ICD-10-CM | POA: Insufficient documentation

## 2022-11-20 DIAGNOSIS — E119 Type 2 diabetes mellitus without complications: Secondary | ICD-10-CM | POA: Insufficient documentation

## 2022-11-20 DIAGNOSIS — E039 Hypothyroidism, unspecified: Secondary | ICD-10-CM | POA: Insufficient documentation

## 2022-11-20 DIAGNOSIS — K219 Gastro-esophageal reflux disease without esophagitis: Secondary | ICD-10-CM | POA: Insufficient documentation

## 2022-11-20 DIAGNOSIS — R209 Unspecified disturbances of skin sensation: Secondary | ICD-10-CM | POA: Insufficient documentation

## 2022-11-20 DIAGNOSIS — G2581 Restless legs syndrome: Secondary | ICD-10-CM

## 2022-11-20 DIAGNOSIS — Z7984 Long term (current) use of oral hypoglycemic drugs: Secondary | ICD-10-CM

## 2022-11-20 LAB — CBC WITH DIFF
BASOPHIL #: 0.1 10*3/uL (ref 0.00–0.10)
BASOPHIL %: 1 % (ref 0–1)
EOSINOPHIL #: 0.5 10*3/uL (ref 0.00–0.50)
EOSINOPHIL %: 7 %
HCT: 37 % (ref 31.2–41.9)
HGB: 12.7 g/dL (ref 10.9–14.3)
LYMPHOCYTE #: 2.8 10*3/uL (ref 1.00–3.00)
LYMPHOCYTE %: 37 % (ref 16–44)
MCH: 28.8 pg (ref 24.7–32.8)
MCHC: 34.3 g/dL (ref 32.3–35.6)
MCV: 84.1 fL (ref 75.5–95.3)
MONOCYTE #: 0.5 10*3/uL (ref 0.30–1.00)
MONOCYTE %: 7 % (ref 5–13)
MPV: 8.6 fL (ref 7.9–10.8)
NEUTROPHIL #: 3.7 10*3/uL (ref 1.85–7.80)
NEUTROPHIL %: 48 % (ref 43–77)
PLATELETS: 349 10*3/uL (ref 140–440)
RBC: 4.4 10*6/uL (ref 3.63–4.92)
RDW: 14 % (ref 12.3–17.7)
WBC: 7.6 10*3/uL (ref 3.8–11.8)

## 2022-11-20 LAB — LIPID PANEL
CHOL/HDL RATIO: 2.4
CHOLESTEROL: 127 mg/dL (ref <200)
HDL CHOL: 53 mg/dL (ref 23–92)
LDL CALC: 50 mg/dL (ref 0–100)
TRIGLYCERIDES: 121 mg/dL (ref <=150)
VLDL CALC: 24 mg/dL (ref 0–50)

## 2022-11-20 LAB — MAGNESIUM: MAGNESIUM: 1.8 mg/dL — ABNORMAL LOW (ref 1.9–2.7)

## 2022-11-20 LAB — COMPREHENSIVE METABOLIC PNL, FASTING
ALBUMIN/GLOBULIN RATIO: 1.4 (ref 0.8–1.4)
ALBUMIN: 4.3 g/dL (ref 3.5–5.7)
ALKALINE PHOSPHATASE: 69 U/L (ref 34–104)
ALT (SGPT): 18 U/L (ref 7–52)
ANION GAP: 7 mmol/L (ref 4–13)
AST (SGOT): 16 U/L (ref 13–39)
BILIRUBIN TOTAL: 0.3 mg/dL (ref 0.3–1.2)
BUN/CREA RATIO: 10 (ref 6–22)
BUN: 8 mg/dL (ref 7–25)
CALCIUM, CORRECTED: 9.1 mg/dL (ref 8.9–10.8)
CALCIUM: 9.3 mg/dL (ref 8.6–10.3)
CHLORIDE: 99 mmol/L (ref 98–107)
CO2 TOTAL: 29 mmol/L (ref 21–31)
CREATININE: 0.77 mg/dL (ref 0.60–1.30)
ESTIMATED GFR: 96 mL/min/{1.73_m2} (ref >59)
GLOBULIN: 3 (ref 2.9–5.4)
GLUCOSE: 108 mg/dL (ref 74–109)
OSMOLALITY, CALCULATED: 269 mOsm/kg — ABNORMAL LOW (ref 270–290)
POTASSIUM: 3.5 mmol/L (ref 3.5–5.1)
PROTEIN TOTAL: 7.3 g/dL (ref 6.4–8.9)
SODIUM: 135 mmol/L — ABNORMAL LOW (ref 136–145)

## 2022-11-20 LAB — VITAMIN D 25 TOTAL: VITAMIN D: 63 ng/mL (ref 30–100)

## 2022-11-20 LAB — VITAMIN B12: VITAMIN B 12: 237 pg/mL (ref 180–914)

## 2022-11-20 LAB — MICROALBUMIN/CREATININE RATIO, URINE, RANDOM
CREATININE RANDOM URINE: 46 mg/dL — ABNORMAL HIGH (ref 11–26)
MICROALBUMIN RANDOM URINE: <0.2 mg/dL

## 2022-11-20 LAB — CREATINE KINASE (CK), TOTAL, SERUM OR PLASMA: CREATINE KINASE: 115 U/L (ref 30–223)

## 2022-11-20 LAB — THYROID STIMULATING HORMONE (SENSITIVE TSH): TSH: 3.431 u[IU]/mL (ref 0.450–5.330)

## 2022-11-20 MED ORDER — CYCLOBENZAPRINE 10 MG TABLET
10.0000 mg | ORAL_TABLET | Freq: Three times a day (TID) | ORAL | 0 refills | Status: DC
Start: 2022-11-20 — End: 2023-01-22

## 2022-11-20 MED ORDER — CETIRIZINE 10 MG TABLET
10.0000 mg | ORAL_TABLET | Freq: Every day | ORAL | 3 refills | Status: DC | PRN
Start: 2022-11-20 — End: 2023-08-05

## 2022-11-20 MED ORDER — GABAPENTIN 100 MG CAPSULE
100.0000 mg | ORAL_CAPSULE | Freq: Three times a day (TID) | ORAL | 0 refills | Status: DC
Start: 2022-11-20 — End: 2022-12-25

## 2022-11-20 MED ORDER — HYDROCHLOROTHIAZIDE 25 MG TABLET
25.0000 mg | ORAL_TABLET | Freq: Every morning | ORAL | 2 refills | Status: DC
Start: 2022-11-20 — End: 2023-08-05

## 2022-11-20 MED ORDER — LEVOTHYROXINE 50 MCG TABLET
50.0000 ug | ORAL_TABLET | Freq: Every day | ORAL | 3 refills | Status: DC
Start: 2022-11-20 — End: 2023-08-05

## 2022-11-20 MED ORDER — OLMESARTAN 40 MG TABLET
40.0000 mg | ORAL_TABLET | Freq: Every day | ORAL | 3 refills | Status: DC
Start: 2022-11-20 — End: 2023-08-05

## 2022-11-20 MED ORDER — DULOXETINE 30 MG CAPSULE,DELAYED RELEASE
30.0000 mg | DELAYED_RELEASE_CAPSULE | Freq: Two times a day (BID) | ORAL | 1 refills | Status: DC
Start: 2022-11-20 — End: 2023-08-20

## 2022-11-20 MED ORDER — METFORMIN 500 MG TABLET
500.0000 mg | ORAL_TABLET | Freq: Two times a day (BID) | ORAL | 3 refills | Status: DC
Start: 2022-11-20 — End: 2023-08-05

## 2022-11-20 MED ORDER — CHOLECALCIFEROL (VITAMIN D3) 1,250 MCG (50,000 UNIT) CAPSULE
50000.0000 [IU] | ORAL_CAPSULE | ORAL | 3 refills | Status: AC
Start: 2022-11-20 — End: 2023-02-18

## 2022-11-20 NOTE — Nursing Note (Signed)
3 month follow up pt states that she has tingling and stinging in her lower legs and feet for about a month  she did have colonoscopy and EGD

## 2022-11-20 NOTE — Patient Instructions (Addendum)
Patel  last note  EGD and colonoscopy    Dr  Viann Shove  last note

## 2022-11-20 NOTE — Progress Notes (Signed)
Name: Sue Rose                       Date of Birth: 1976/02/28   MRN:  [id number]                         Date of visit: 11/20/2022     PCP: Delaney Meigs, PA-C     Subjective  Sue Rose is a 47 y.o. year old female who presents for Follow Up 3 Months (3 month follow up pt states that she has tingling and stinging in her lower legs and feet for about a month  she did have colonoscopy and EGD )   to clinic.  No specialty comments available.   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 01/21/2022    Symptomatic varicose veins 01/21/2022    Thyroid nodule 01/21/2022    Type 2 diabetes mellitus without complication (CMS [place]) 99991111    Vitamin D deficiency 01/21/2022      Current Outpatient Medications   Medication Sig    aspirin 81 mg Oral Tablet, Chewable Chew 1 Tablet (81 mg total) Once a day    cetirizine (ZYRTEC) 10 mg Oral Tablet Take 1 Tablet (10 mg total) by mouth Once per day as needed for Allergies    cholecalciferol, vitamin D3, 1,250 mcg (50,000 unit) Oral Capsule Take 1 Capsule (50,000 Units total) by mouth Every 7 days for 90 days    cyanocobalamin (VITAMIN B12) 1,000 mcg/mL Injection Solution Inject 1 mL (1,000 mcg total) into the muscle Once a day    cyclobenzaprine (FLEXERIL) 10 mg Oral Tablet Take 1 Tablet (10 mg total) by mouth Three times a day    DULoxetine (CYMBALTA DR) 30 mg Oral Capsule, Delayed Release(E.C.) Take 1 Capsule (30 mg total) by mouth  Twice daily    ergocalciferol, vitamin D2, (DRISDOL) 1,250 mcg (50,000 unit) Oral Capsule TAKE ONE CAPSULE BY MOUTH ONCE WEEKLY    escitalopram oxalate (LEXAPRO) 10 mg Oral Tablet Take 1 Tablet (10 mg total) by mouth Once a day    famotidine (PEPCID) 40 mg Oral Tablet Take 1 Tablet (40 mg total) by mouth Twice daily (Patient taking differently: Take 1 Tablet (40 mg total) by mouth Every evening)    gabapentin (NEURONTIN) 100 mg Oral Capsule Take 1 Capsule (100 mg total) by mouth Three times a day    hydroCHLOROthiazide (HYDRODIURIL) 25 mg Oral Tablet Take 1 Tablet (25 mg total) by mouth Every morning    levothyroxine (SYNTHROID) 50 mcg Oral Tablet Take 1 Tablet (50 mcg total) by mouth Once a day    metFORMIN (GLUCOPHAGE) 500 mg Oral Tablet Take 1 Tablet (500 mg total) by mouth Twice daily with food Patient only takes once a day    nitroGLYCERIN (NITROSTAT) 0.4 mg Sublingual Tablet, Sublingual Place 1 Tablet (0.4 mg total) under the tongue Every 5 minutes as needed for Chest pain for 3 doses over 15 minutes    olmesartan (  BENICAR) 40 mg Oral Tablet Take 1 Tablet (40 mg total) by mouth Once a day    omeprazole (PRILOSEC) 40 mg Oral Capsule, Delayed Release(E.C.) Take 1 Capsule (40 mg total) by mouth Once a day (Patient taking differently: Take 1 Capsule (40 mg total) by mouth Twice daily)        Chronic Disease Management-AMB  FU VISIT   Pt here for fu of  multiple medical problems    Fu GERD   Prilosec  to one bid   and Pepcid  one  at night  but had to change it to one in am         Co  tingling in  feet    few months    at times worse  at night      Restless  legs  at times          Co  DOE     Pt was referred to DR Viann Shove pt underwent  echo  stress test and s he just completed Heart Cath  Pt has fu this  week  Dr Bobby Rumpf was brought in but no intervention    Newly on Crestor 20 and so far tolerating it   co q 10 discussed as well     Fu SOB  pt seen by pulm   Dr Catha Nottingham     FU pain in  knees and legs     seen by   Ortho Dr Margretta Sidle   no surgery  no inj     Pt was on celebrex  200 bid  some improvement   Now off due to above    FU  back pain and radiculopathy patient did  see neurosurgeon in [place] and basically was told nothing to do at this time       First to try  tylenol   tid     Refused intervention or gabapentin     Fu  Vericose veins   complains of leg pain undescribable feeling at times and concerned about her varicose veins   Pt went to see Dr Eduard Roux  He wants to do procedure but  Pt canceled it      Follow-up GI issues patient was off metformin was doing some better  she is back on medication will need to monitor      FU TYPE 2 DM ON METFORMIN THAN HAD GI ISSUES  WAS OFF  AND TRIED BACK ON now  But only  500 mg one daily        INJ DISCUSSED  DUE TO  NEED FOR  CONTROL OF  BS  AND   WT LOSS NEED     TRIAL OF  TRULICITY  OZEMPIC  PT  FAILED   CO yeast with farxiga jardiance and lastly  Januvia   I reminded her that  High BS can cause  Yeast as well.    Will fu aic  This time and if elevated will have to  Either increase  Metformin or add another agent   BS discussed    Pt has lost over  20 lbs in last few months will check  aic     FU HX of  COVID   pt has not felt well since having covid         FU LOW VIT D  ON SUPPLEMENT    FU NASH       [place]  + FATTY LIVER  DZ   REVIEWED WITH  PT     FU WT GAIN   AND METABOLIC SYNDROME X   DISCUSSED WT LOSS MEDS  IN THE PAST     FU  GERD  PT  ON  PEPCID AND PRILOSEC    FU  HTN BP  HAS BEEN STABLE  AT  HOME       + cough on ACE   now on  BENICAR      Pt had ablation   no menses  and does get hot flashes      BS  pp  131  today   and   88 later    To see Dr Catha Nottingham in  fu   needs pulse ox overnight   PFT  done  no  fu yet fu at end of month     Fu javed  post cath     Crestor   20  asa  now  avoid  NSAID and  cox  2    Refused flu  shot    Mammogram up to date    25 lbs  down since June    Ua for dysuria             REVIEW OF SYSTEMS:   Review of Systems  General: No fever.  No  chills.  No weight changes.  HEENT: No vision changes.  Cardiac: No chest pain. No palpitations.  No dizziness.  No light-headedness.  No near syncope.  Resp: No dyspnea at rest, no dyspnea on exertion; no cough or hemoptysis; no orthopnea or PND.  GI: No N/V. No melena.  No bright red blood per rectum.  Ext: No edema.  No claudication.  Neuro: No focal weakness.  No numbness.  All other ROS negative.      Objective:   BP (!) 130/28 (Site: Left, Patient Position: Sitting)   Pulse 72   Temp 36.7 C (98 F)   Ht 1.651 m (5' 5"$ )   Wt 97.5 kg (215 lb)   SpO2 99%   BMI 35.78 kg/m              PHYSICAL EXAM  Physical Exam  Gen: NAD. Alert.   HEENT: PERRL; conjunctivae clear. No JVD or carotid bruit.  Cardiac: RRR with normal S1, S2.   Lungs: Clear to auscultation bilaterally. No rales. No wheezing. No rhonchi.  Abdomen: Soft, non-tender.non-distended  nl bowel sounds    Extremities: No edema. No cyanosis. No clubbing.  Neurologic:  Grossly intact    Lab Results   Component Value Date    CHOLESTEROL 127 11/20/2022    HDLCHOL 53 11/20/2022    LDLCHOL 50 11/20/2022    TRIG 121 11/20/2022       COMPLETE BLOOD COUNT   Lab Results   Component Value Date    WBC 7.6 11/20/2022    HGB 12.7 11/20/2022    HCT 37.0 11/20/2022    PLTCNT 349 11/20/2022       DIFFERENTIAL  Lab Results   Component Value Date    PMNS 48 11/20/2022    LYMPHOCYTES 37 11/20/2022    MONOCYTES 7 11/20/2022    EOSINOPHIL 7 11/20/2022    BASOPHILS 1 11/20/2022    BASOPHILS 0.10 11/20/2022    PMNABS 3.70 11/20/2022    LYMPHSABS 2.80 11/20/2022    EOSABS 0.50 11/20/2022    MONOSABS 0.50 11/20/2022        COMPREHENSIVE METABOLIC PANEL   Lab Results   Component Value Date  SODIUM 135 (L) 11/20/2022    POTASSIUM 3.5 11/20/2022    CHLORIDE 99 11/20/2022    CO2 29 11/20/2022    ANIONGAP 7 11/20/2022    BUN 8 11/20/2022    CREATININE 0.77 11/20/2022    GLUCOSENF 108 11/20/2022    CALCIUM 9.3 11/20/2022    ALBUMIN 4.3 11/20/2022    TOTALPROTEIN 7.3 11/20/2022     ALKPHOS 69 11/20/2022    AST 16 11/20/2022    ALT 18 11/20/2022            THYROID STIMULATING HORMONE  Lab Results   Component Value Date    TSH 3.431 11/20/2022        Lab Results   Component Value Date    HA1C 6.3 (H) 11/20/2022     Lab Results   Component Value Date    VITD 63 11/20/2022         Assessment/Plan  Problem List Items Addressed This Visit          Cardiovascular System    Essential hypertension - Primary    Relevant Orders    CBC/DIFF (Completed)    COMPREHENSIVE METABOLIC PNL, FASTING (Completed)    THYROID STIMULATING HORMONE (SENSITIVE TSH) (Completed)    LIPID PANEL (Completed)    HGA1C (HEMOGLOBIN A1C WITH EST AVG GLUCOSE) (Completed)    VITAMIN D 25 TOTAL (Completed)    MICROALBUMIN/CREATININE RATIO, URINE, RANDOM (Completed)    VITAMIN B12 (Completed)    MAGNESIUM (Completed)    CREATINE KINASE (CK), TOTAL, SERUM (Completed)       Digestive    Esophageal reflux    Relevant Orders    CBC/DIFF (Completed)    COMPREHENSIVE METABOLIC PNL, FASTING (Completed)    THYROID STIMULATING HORMONE (SENSITIVE TSH) (Completed)    LIPID PANEL (Completed)    HGA1C (HEMOGLOBIN A1C WITH EST AVG GLUCOSE) (Completed)    VITAMIN D 25 TOTAL (Completed)    MICROALBUMIN/CREATININE RATIO, URINE, RANDOM (Completed)    VITAMIN B12 (Completed)    MAGNESIUM (Completed)    CREATINE KINASE (CK), TOTAL, SERUM (Completed)       Endocrine    Acquired hypothyroidism    Relevant Orders    CBC/DIFF (Completed)    COMPREHENSIVE METABOLIC PNL, FASTING (Completed)    THYROID STIMULATING HORMONE (SENSITIVE TSH) (Completed)    LIPID PANEL (Completed)    HGA1C (HEMOGLOBIN A1C WITH EST AVG GLUCOSE) (Completed)    VITAMIN D 25 TOTAL (Completed)    MICROALBUMIN/CREATININE RATIO, URINE, RANDOM (Completed)    VITAMIN B12 (Completed)    MAGNESIUM (Completed)    CREATINE KINASE (CK), TOTAL, SERUM (Completed)    Type 2 diabetes mellitus without complication (CMS [facility])    Relevant Orders    CBC/DIFF (Completed)    COMPREHENSIVE METABOLIC PNL,  FASTING (Completed)    THYROID STIMULATING HORMONE (SENSITIVE TSH) (Completed)    LIPID PANEL (Completed)    HGA1C (HEMOGLOBIN A1C WITH EST AVG GLUCOSE) (Completed)    VITAMIN D 25 TOTAL (Completed)    MICROALBUMIN/CREATININE RATIO, URINE, RANDOM (Completed)    VITAMIN B12 (Completed)    MAGNESIUM (Completed)    CREATINE KINASE (CK), TOTAL, SERUM (Completed)    Vitamin D deficiency    Relevant Orders    CBC/DIFF (Completed)    COMPREHENSIVE METABOLIC PNL, FASTING (Completed)    THYROID STIMULATING HORMONE (SENSITIVE TSH) (Completed)    LIPID PANEL (Completed)    HGA1C (HEMOGLOBIN A1C WITH EST AVG GLUCOSE) (Completed)    VITAMIN D 25 TOTAL (Completed)  MICROALBUMIN/CREATININE RATIO, URINE, RANDOM (Completed)    VITAMIN B12 (Completed)    MAGNESIUM (Completed)    CREATINE KINASE (CK), TOTAL, SERUM (Completed)     Other Visit Diagnoses       RLS (restless legs syndrome)        Relevant Orders    VITAMIN B12 (Completed)    MAGNESIUM (Completed)    CREATINE KINASE (CK), TOTAL, SERUM (Completed)    Skin sensation disturbance        Relevant Orders    VITAMIN B12 (Completed)    MAGNESIUM (Completed)    CREATINE KINASE (CK), TOTAL, SERUM (Completed)    Referral to External Provider (AMB)             Orders Placed This Encounter    CBC/DIFF    COMPREHENSIVE METABOLIC PNL, FASTING    THYROID STIMULATING HORMONE (SENSITIVE TSH)    LIPID PANEL    HGA1C (HEMOGLOBIN A1C WITH EST AVG GLUCOSE)    VITAMIN D 25 TOTAL    MICROALBUMIN/CREATININE RATIO, URINE, RANDOM    VITAMIN B12    MAGNESIUM    CREATINE KINASE (CK), TOTAL, SERUM    CBC WITH DIFF    Referral to External Provider (AMB)    cholecalciferol, vitamin D3, 1,250 mcg (50,000 unit) Oral Capsule    levothyroxine (SYNTHROID) 50 mcg Oral Tablet    metFORMIN (GLUCOPHAGE) 500 mg Oral Tablet    olmesartan (BENICAR) 40 mg Oral Tablet    hydroCHLOROthiazide (HYDRODIURIL) 25 mg Oral Tablet    cyclobenzaprine (FLEXERIL) 10 mg Oral Tablet    cetirizine (ZYRTEC) 10 mg Oral Tablet     gabapentin (NEURONTIN) 100 mg Oral Capsule    DULoxetine (CYMBALTA DR) 30 mg Oral Capsule, Delayed Release(E.C.)        Meds reviewed as well as labs.  Chart reviewed and updated.   Continue current treatment.  Keep follow-up appointment.   Vaccine hx reviewed.   Discussed  causes of  numbness tingling  from   back to  dm  to   rls   will check labs     EGD  and colonoscopy   dr Posey Pronto   ( pt  made her own appt   for having  bleeding and pain with  bowel movement)  pt was on  stool softner and  metamucil   but  increase in pain   she needs to  fu  with Dr Posey Pronto     Cymbalta  will start   30 mg  one  per  week x  1 week than   2 daily     Dr Viann Shove  last  note    Will  hold on  the Neurontin     Dr Davy Pique   for eval  of   numbness tingling in lower ext

## 2022-11-21 LAB — HGA1C (HEMOGLOBIN A1C WITH EST AVG GLUCOSE): HEMOGLOBIN A1C: 6.3 % — ABNORMAL HIGH (ref 4.0–6.0)

## 2022-11-22 ENCOUNTER — Other Ambulatory Visit (INDEPENDENT_AMBULATORY_CARE_PROVIDER_SITE_OTHER): Payer: Self-pay | Admitting: Internal Medicine

## 2022-11-22 DIAGNOSIS — E538 Deficiency of other specified B group vitamins: Secondary | ICD-10-CM

## 2022-11-22 MED ORDER — CYANOCOBALAMIN (VIT B-12) 1,000 MCG/ML INJECTION SOLUTION
1000.0000 ug | Freq: Every day | INTRAMUSCULAR | 2 refills | Status: DC
Start: 2022-11-22 — End: 2023-08-20

## 2022-11-26 ENCOUNTER — Telehealth (INDEPENDENT_AMBULATORY_CARE_PROVIDER_SITE_OTHER): Payer: Self-pay | Admitting: Internal Medicine

## 2022-11-26 MED ORDER — ESCITALOPRAM 10 MG TABLET
10.0000 mg | ORAL_TABLET | Freq: Every day | ORAL | 2 refills | Status: DC
Start: 2022-11-26 — End: 2023-04-01

## 2022-11-27 ENCOUNTER — Other Ambulatory Visit (RURAL_HEALTH_CENTER): Payer: Self-pay | Admitting: Internal Medicine

## 2022-12-04 ENCOUNTER — Telehealth (INDEPENDENT_AMBULATORY_CARE_PROVIDER_SITE_OTHER): Payer: Self-pay | Admitting: Internal Medicine

## 2022-12-18 ENCOUNTER — Encounter (INDEPENDENT_AMBULATORY_CARE_PROVIDER_SITE_OTHER): Payer: Self-pay | Admitting: Internal Medicine

## 2022-12-20 ENCOUNTER — Other Ambulatory Visit (INDEPENDENT_AMBULATORY_CARE_PROVIDER_SITE_OTHER): Payer: Self-pay | Admitting: Internal Medicine

## 2022-12-25 ENCOUNTER — Telehealth (INDEPENDENT_AMBULATORY_CARE_PROVIDER_SITE_OTHER): Payer: Self-pay | Admitting: Internal Medicine

## 2022-12-25 MED ORDER — GABAPENTIN 300 MG CAPSULE
300.0000 mg | ORAL_CAPSULE | Freq: Three times a day (TID) | ORAL | 1 refills | Status: DC
Start: 2022-12-25 — End: 2023-02-19

## 2022-12-31 ENCOUNTER — Telehealth (INDEPENDENT_AMBULATORY_CARE_PROVIDER_SITE_OTHER): Payer: Self-pay | Admitting: Internal Medicine

## 2022-12-31 ENCOUNTER — Other Ambulatory Visit (INDEPENDENT_AMBULATORY_CARE_PROVIDER_SITE_OTHER): Payer: Self-pay | Admitting: Internal Medicine

## 2022-12-31 DIAGNOSIS — M5416 Radiculopathy, lumbar region: Secondary | ICD-10-CM

## 2022-12-31 DIAGNOSIS — R94130 Abnormal response to nerve stimulation, unspecified: Secondary | ICD-10-CM

## 2023-01-02 IMAGING — MR MRI LUMBAR SPINE WITHOUT CONTRAST
7 series · 48 of 48 positions shown · IV contrast (gadolinium)
Comparison: None available.

﻿EXAM:  [id number]   MRI LUMBAR SPINE WITHOUT CONTRAST
INDICATION: 46-year-old with chronic low back pain radiating to left leg and left hip. No history of back surgery or malignancy.
TECHNIQUE: Multiplanar, multisequential MRI of the lumbosacral spine was performed without gadolinium contrast.

[Series 4: s-map · sagittal · 10.6mm · 5.31mm/px · 25 of 100 slices shown]
[im 1/100]
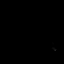
[im 5/100]
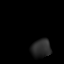
[im 9/100]
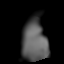
[im 13/100]
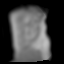
[im 17/100]
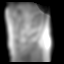
[im 21/100]
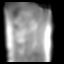
[im 25/100]
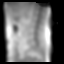
[im 29/100]
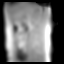
[im 34/100]
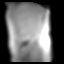
[im 38/100]
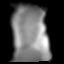
[im 42/100]
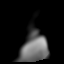
[im 46/100]
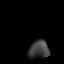
[im 50/100]
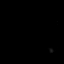
[im 54/100]
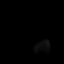
[im 58/100]
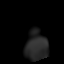
[im 62/100]
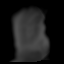
[im 67/100]
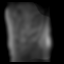
[im 71/100]
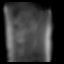
[im 75/100]
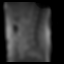
[im 79/100]
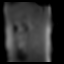
[im 83/100]
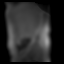
[im 87/100]
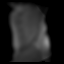
[im 91/100]
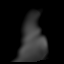
[im 95/100]
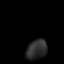
[im 100/100]
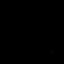

[Series 5: T2 · sagittal · 4.0mm · 0.94mm/px · 3 of 13 slices shown (1 of 3)]
[im 1/13]
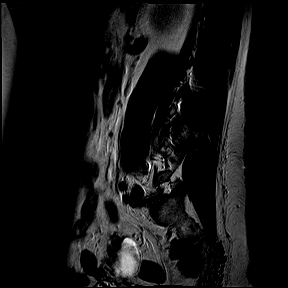
[im 7/13]
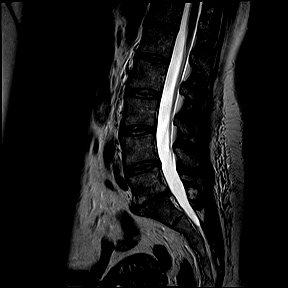
[im 13/13]
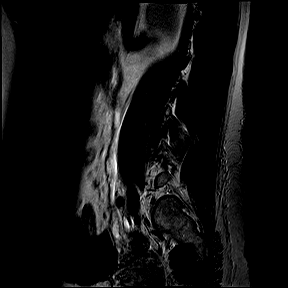

[Series 6: T1 · sagittal · 4.0mm · 0.94mm/px · 3 of 13 slices shown (1 of 2)]
[im 1/13]
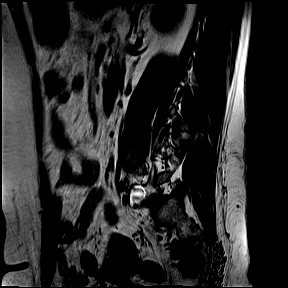
[im 7/13]
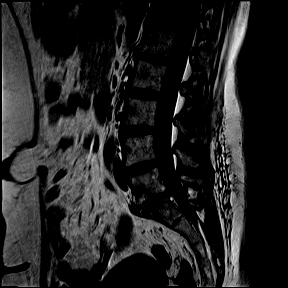
[im 13/13]
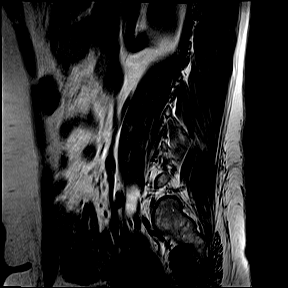

[Series 7: STIR · sagittal · 4.0mm · 1.05mm/px · 3 of 13 slices shown]
[im 1/13]
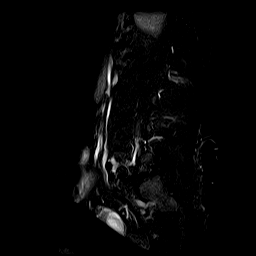
[im 7/13]
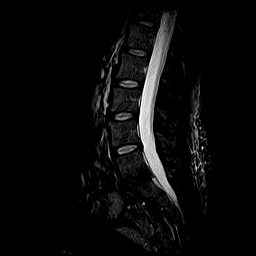
[im 13/13]
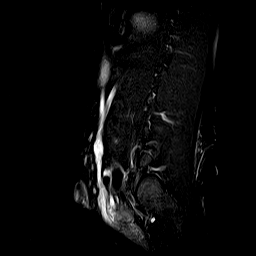

[Series 8: T2 · axial · 4.0mm · 0.52mm/px · z∈[-94,+110]mm · 5 of 23 slices shown (2 of 3)]
[im 1/23]
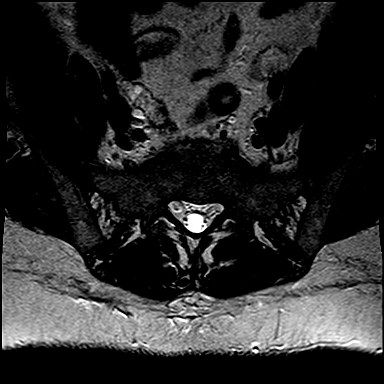
[im 6/23]
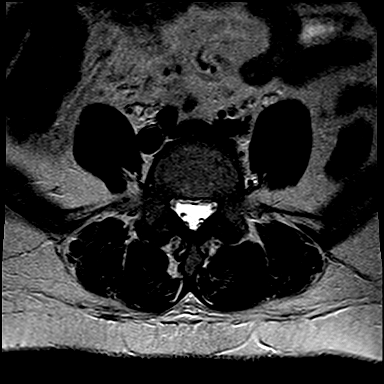
[im 12/23]
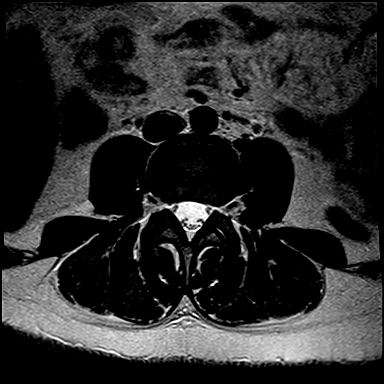
[im 17/23]
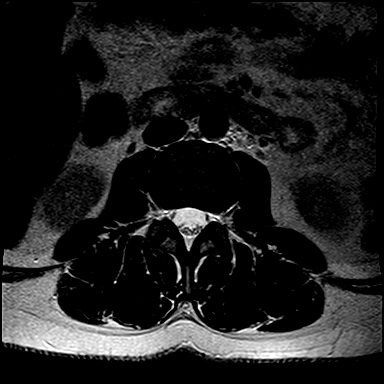
[im 23/23]
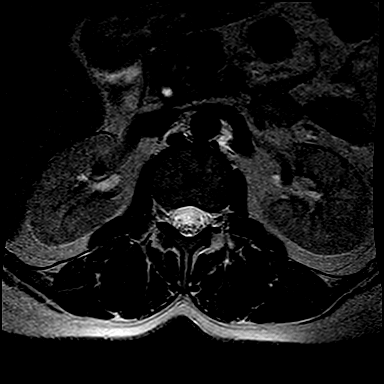

[Series 9: T1 · axial · 4.0mm · 0.52mm/px · z∈[-94,+110]mm · 5 of 23 slices shown (2 of 2)]
[im 1/23]
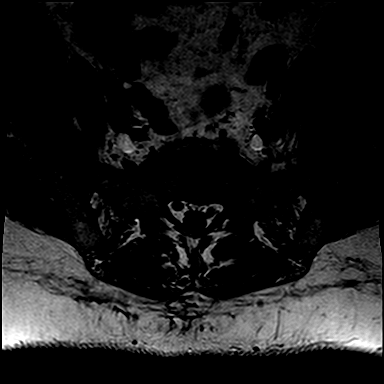
[im 6/23]
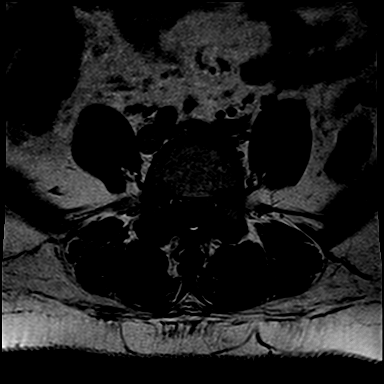
[im 12/23]
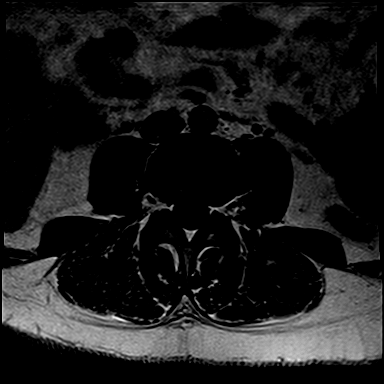
[im 17/23]
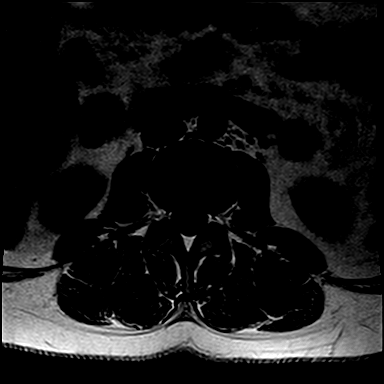
[im 23/23]
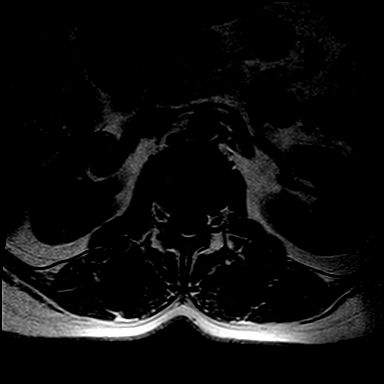

[Series 10: T2 · coronal · 5.0mm · 0.82mm/px · 4 of 18 slices shown (3 of 3)]
[im 1/18]
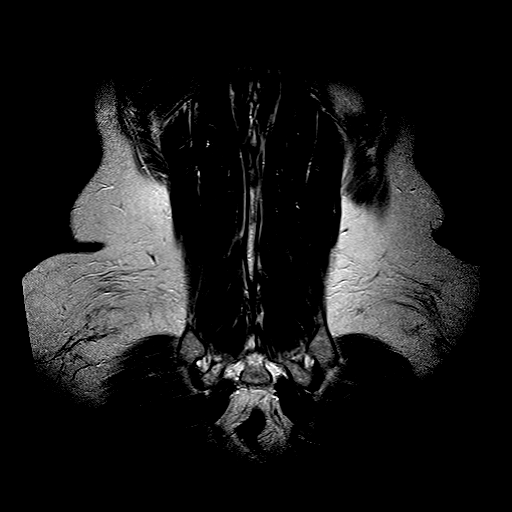
[im 6/18]
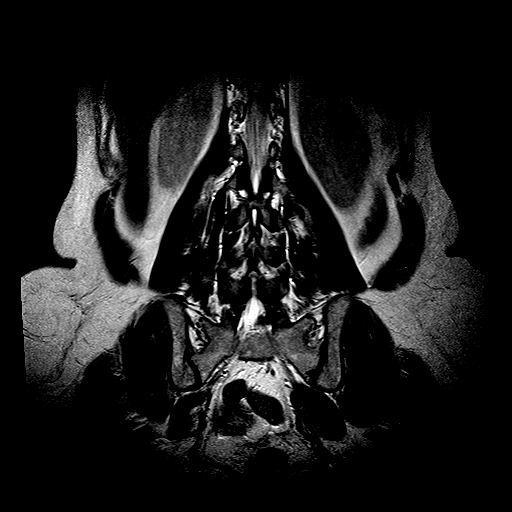
[im 12/18]
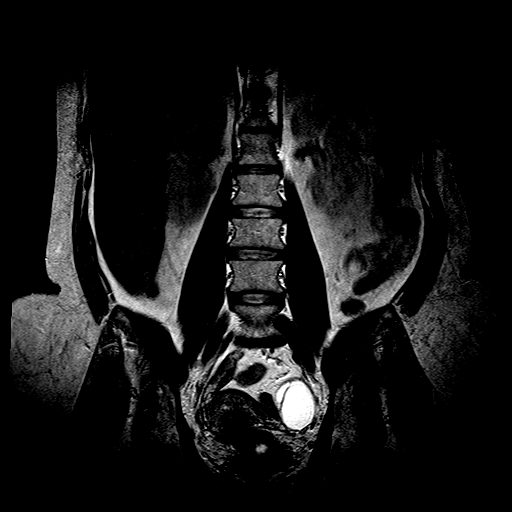
[im 18/18]
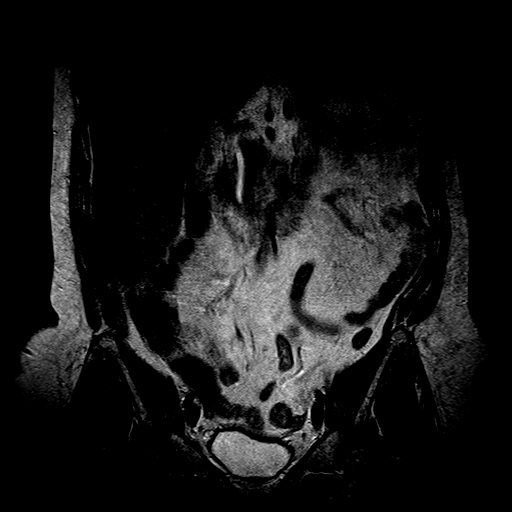

[48 of 48 positions shown; findings below may reference images not displayed]

FINDINGS: No acute bony lesions of lumbar vertebrae are seen.  Lower spinal cord and cauda equina are normal in the sagittal projection. 

L1-2 disc shows no focal abnormalities.

L2-3 disc shows no focal abnormalities.

At L3-4 level, no focal disc lesions are seen. Mild facet arthropathy causes mild compromise of both lateral recesses. 

At L4-5 level, mild degenerative disc disease with bulging annulus and facet arthropathy cause mild biforaminal narrowing. 

At L5-S1 level, significant loss of disc space is noted.  Large disc protrusion is noted extending from midline to the foraminal level on the left side, 16 mm in width causing severe compromise of left lateral recess and left neural foramen impinging on multiple left-sided nerve roots including left L5 and S1 nerve roots.  Moderate right foraminal narrowing due to the disc changes and facet arthropathy.

Paravertebral soft tissues show bilocular cystic mass of the left adnexa measuring 4.2 cm in diameter.
IMPRESSION: 1. No acute bone changes of lumbar vertebrae. 

2. At L5-S1 level, significant loss of disc space is noted.  Large disc protrusion is noted extending from midline to the foraminal level on the left side, 16 mm in width causing severe compromise of left lateral recess and left neural foramina impinging on multiple left-sided nerve roots including left L5 and S1 nerve roots.  Moderate right foraminal narrowing due to the disc changes and facet arthropathy.

3. Findings at other disc levels are described above in detail. 

4. A 4.2 cm size bilocular cystic mass of left pelvic adnexa.  Further evaluation is suggested with transabdominal and transvaginal pelvic ultrasound.

## 2023-01-07 ENCOUNTER — Other Ambulatory Visit (INDEPENDENT_AMBULATORY_CARE_PROVIDER_SITE_OTHER): Payer: Self-pay | Admitting: Internal Medicine

## 2023-01-07 DIAGNOSIS — N83209 Unspecified ovarian cyst, unspecified side: Secondary | ICD-10-CM

## 2023-01-09 ENCOUNTER — Telehealth (INDEPENDENT_AMBULATORY_CARE_PROVIDER_SITE_OTHER): Payer: Self-pay | Admitting: Internal Medicine

## 2023-01-09 DIAGNOSIS — M5126 Other intervertebral disc displacement, lumbar region: Secondary | ICD-10-CM

## 2023-01-13 ENCOUNTER — Telehealth (INDEPENDENT_AMBULATORY_CARE_PROVIDER_SITE_OTHER): Payer: Self-pay | Admitting: Internal Medicine

## 2023-01-16 ENCOUNTER — Telehealth (INDEPENDENT_AMBULATORY_CARE_PROVIDER_SITE_OTHER): Payer: Self-pay | Admitting: Internal Medicine

## 2023-01-22 ENCOUNTER — Other Ambulatory Visit (INDEPENDENT_AMBULATORY_CARE_PROVIDER_SITE_OTHER): Payer: Self-pay | Admitting: Internal Medicine

## 2023-01-24 IMAGING — US US PELVIC AND TRANSVAGINAL
1 series · 13 of 25 positions shown · non-contrast
Comparison: MRI lumbar spine dated 01/02/2023.

﻿EXAM:  [DATE]   US PELVIC AND TRANSVAGINAL
INDICATION: 46-year-old with previous history of endometrial ablation.  History of vaginal bleeding 3 weeks ago after [id number].  Previous C-section.  Finding of 4.2 cm bilocular cystic mass of left adnexa on MRI lumbar spine.
TECHNIQUE: Transabdominal and transvaginal examination with Doppler.

[Series 1: us pelvic and transvaginal · 13 of 66 slices shown]
[im 1/66]
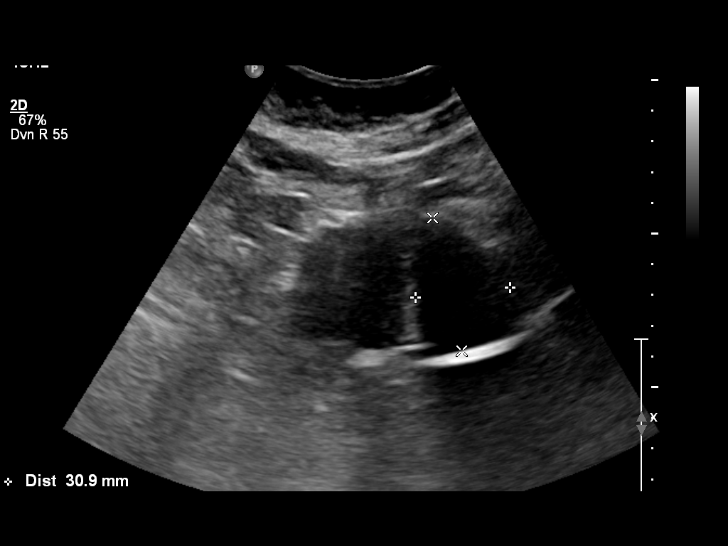
[im 6/66]
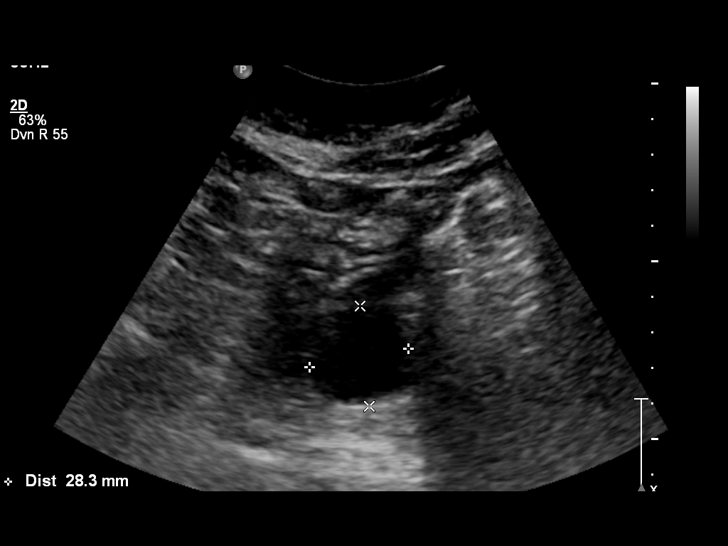
[im 11/66]
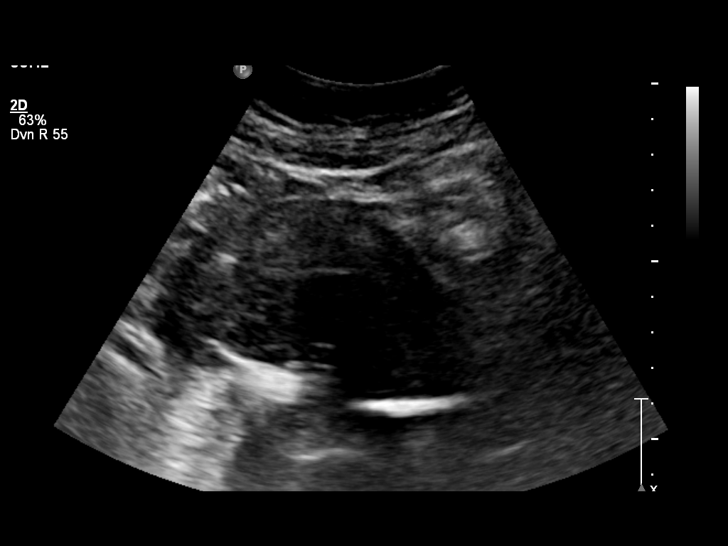
[im 17/66]
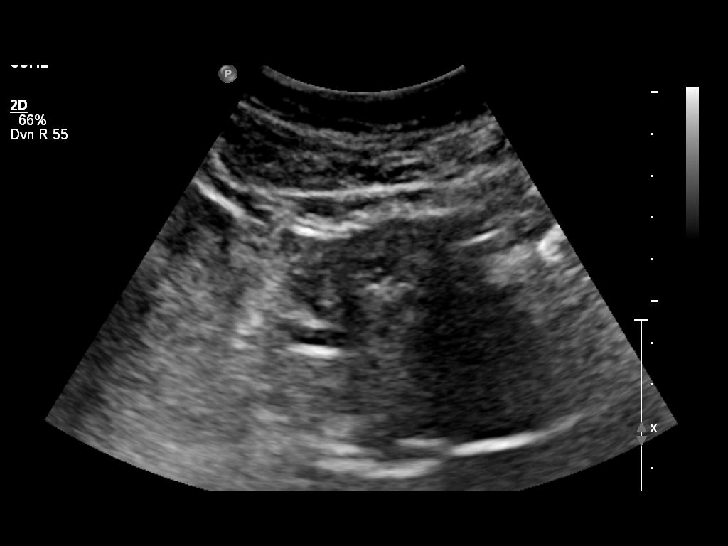
[im 22/66]
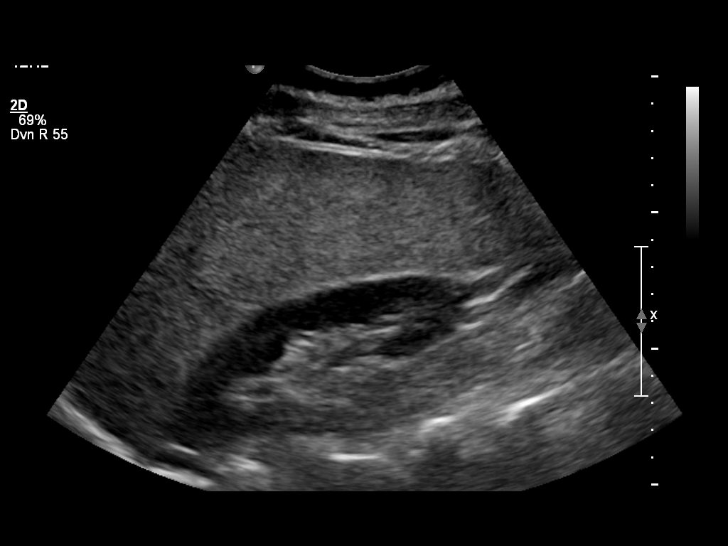
[im 28/66]
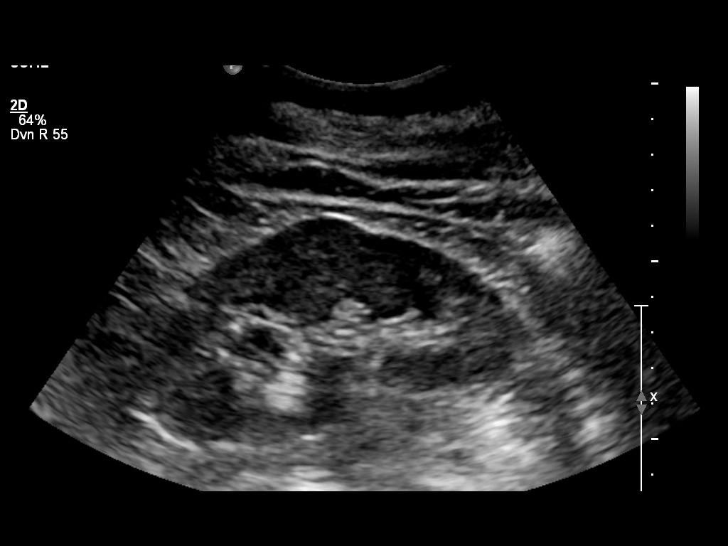
[im 33/66]
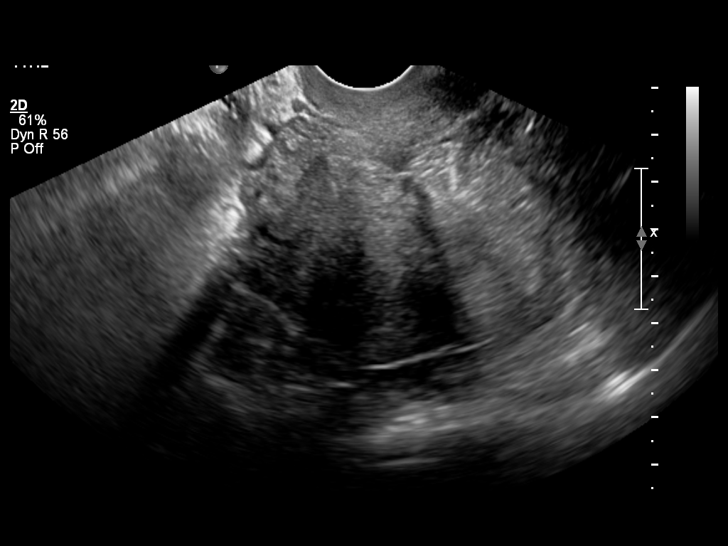
[im 38/66]
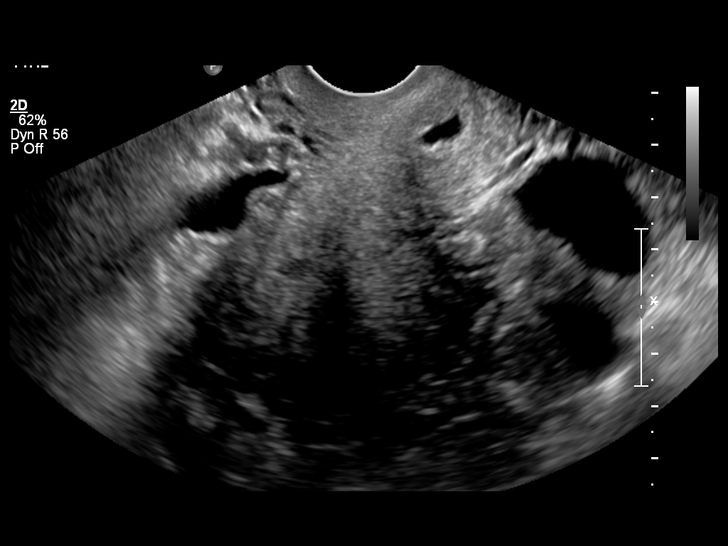
[im 44/66]
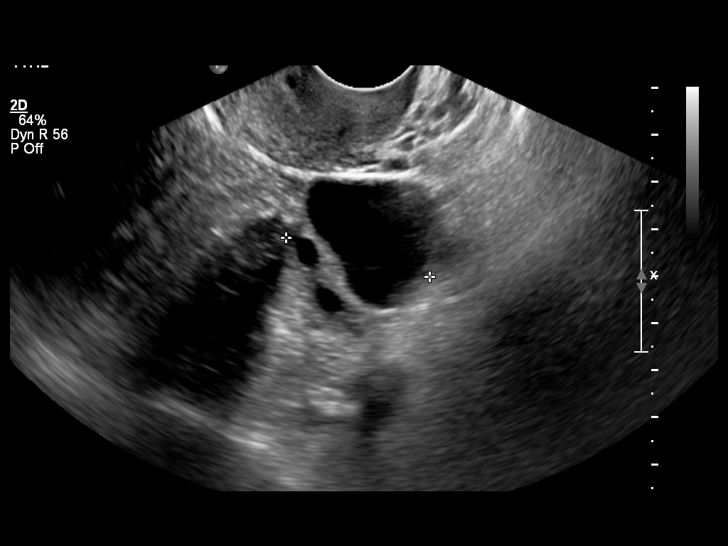
[im 49/66]
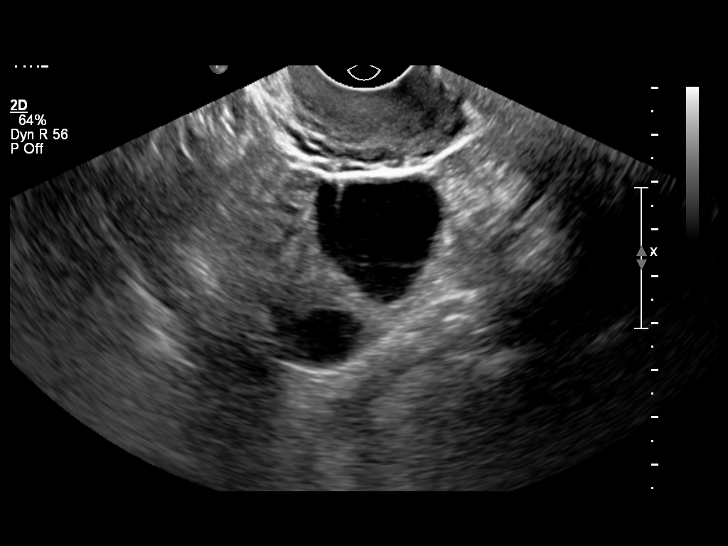
[im 55/66]
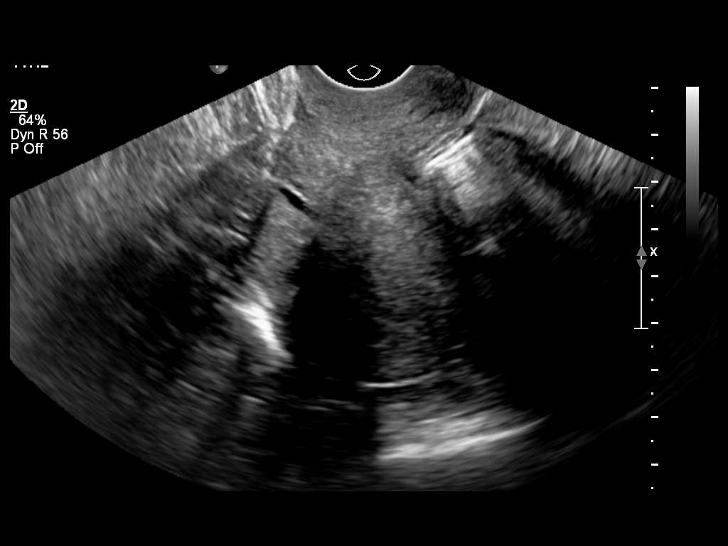
[im 60/66]
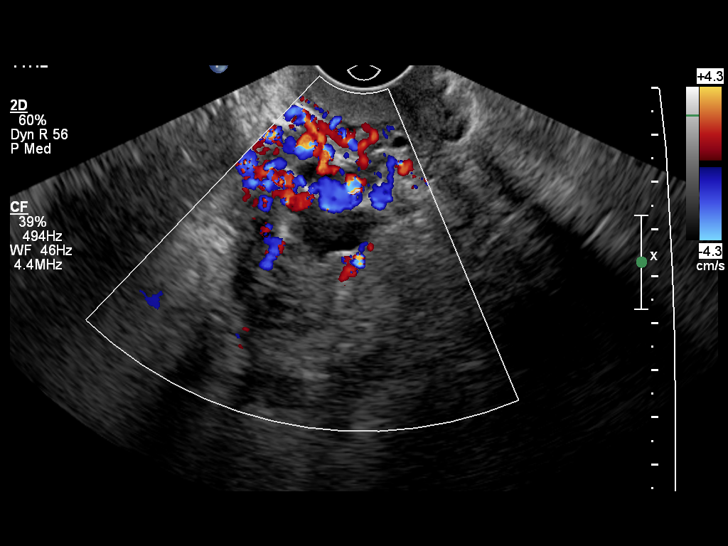
[im 66/66]
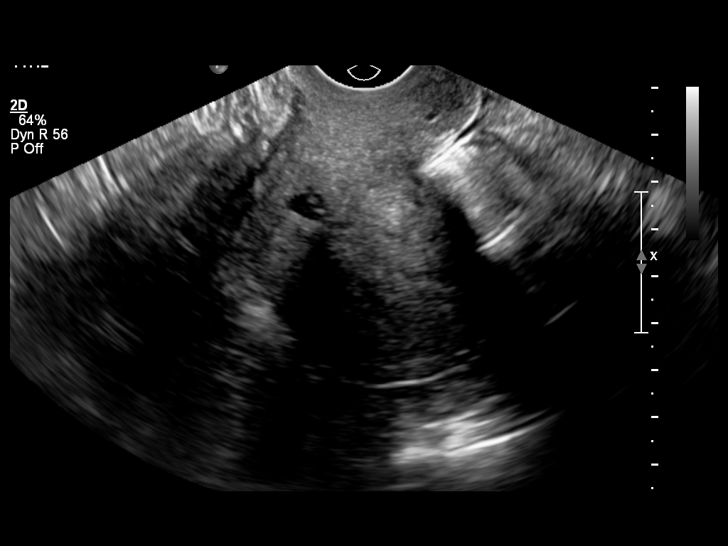

[13 of 25 positions shown; findings below may reference images not displayed]

FINDINGS: Overall visualization is significantly limited due to the body habitus. Bulky uterus measuring a length of 9.7 cm and a width of 5 cm is noted in retroverted position.  Possible 3 cm fibroid at the fundus of the uterus. 

Bilocular cystic mass of the left ovary is noted 4 x 3.5 cm in size with thick septum.  No free fluid in the pelvis.
IMPRESSION: 1. Limited visualization due to bulky retroverted uterus.  Possible fibroid at the fundus of the uterus.  Endometrium is not satisfactorily visualized.

2. Complex cystic mass of left ovary measuring 4 x 3.5 cm in size.  Ovarian neoplasm is not ruled out.  Endometriosis would be in the differential diagnosis.  GYN consultation is recommended. Additional imaging with CT scan with and without contrast or MRI with and without contrast may be considered of the pelvis.  Please correlate with serum markers including X0-A5E level.

## 2023-01-27 ENCOUNTER — Telehealth (INDEPENDENT_AMBULATORY_CARE_PROVIDER_SITE_OTHER): Payer: Self-pay | Admitting: Internal Medicine

## 2023-01-28 ENCOUNTER — Other Ambulatory Visit (INDEPENDENT_AMBULATORY_CARE_PROVIDER_SITE_OTHER): Payer: Self-pay | Admitting: Internal Medicine

## 2023-01-28 DIAGNOSIS — R9389 Abnormal findings on diagnostic imaging of other specified body structures: Secondary | ICD-10-CM

## 2023-02-05 ENCOUNTER — Telehealth (INDEPENDENT_AMBULATORY_CARE_PROVIDER_SITE_OTHER): Payer: Self-pay | Admitting: Internal Medicine

## 2023-02-17 ENCOUNTER — Ambulatory Visit: Payer: 59 | Attending: INTERNAL MEDICINE

## 2023-02-17 ENCOUNTER — Other Ambulatory Visit (RURAL_HEALTH_CENTER): Payer: Self-pay | Admitting: Internal Medicine

## 2023-02-17 ENCOUNTER — Other Ambulatory Visit: Payer: Self-pay

## 2023-02-17 DIAGNOSIS — R0602 Shortness of breath: Secondary | ICD-10-CM | POA: Insufficient documentation

## 2023-02-18 LAB — BASIC METABOLIC PANEL
ANION GAP: 7 mmol/L (ref 4–13)
BUN/CREA RATIO: 9 (ref 6–22)
BUN: 7 mg/dL (ref 7–25)
CALCIUM: 9.7 mg/dL (ref 8.6–10.3)
CHLORIDE: 98 mmol/L (ref 98–107)
CO2 TOTAL: 29 mmol/L (ref 21–31)
CREATININE: 0.8 mg/dL (ref 0.60–1.30)
ESTIMATED GFR: 91 mL/min/{1.73_m2} (ref >59)
GLUCOSE: 101 mg/dL (ref 74–109)
OSMOLALITY, CALCULATED: 266 mOsm/kg — ABNORMAL LOW (ref 270–290)
POTASSIUM: 4.2 mmol/L (ref 3.5–5.1)
SODIUM: 134 mmol/L — ABNORMAL LOW (ref 136–145)

## 2023-02-19 ENCOUNTER — Other Ambulatory Visit (INDEPENDENT_AMBULATORY_CARE_PROVIDER_SITE_OTHER): Payer: Self-pay | Admitting: Internal Medicine

## 2023-02-22 LAB — NT-PROBNP: NT-PROBNP: 58 pg/mL (ref <125)

## 2023-04-01 ENCOUNTER — Other Ambulatory Visit (INDEPENDENT_AMBULATORY_CARE_PROVIDER_SITE_OTHER): Payer: Self-pay | Admitting: Internal Medicine

## 2023-04-01 ENCOUNTER — Encounter (INDEPENDENT_AMBULATORY_CARE_PROVIDER_SITE_OTHER): Payer: Self-pay | Admitting: Internal Medicine

## 2023-04-01 ENCOUNTER — Other Ambulatory Visit: Payer: Self-pay

## 2023-04-01 ENCOUNTER — Ambulatory Visit: Payer: 59 | Attending: Internal Medicine | Admitting: Internal Medicine

## 2023-04-01 VITALS — BP 124/84 | HR 74 | Temp 98.3°F | Ht 65.0 in

## 2023-04-01 DIAGNOSIS — E559 Vitamin D deficiency, unspecified: Secondary | ICD-10-CM

## 2023-04-01 DIAGNOSIS — I10 Essential (primary) hypertension: Secondary | ICD-10-CM | POA: Insufficient documentation

## 2023-04-01 DIAGNOSIS — K7581 Nonalcoholic steatohepatitis (NASH): Secondary | ICD-10-CM

## 2023-04-01 DIAGNOSIS — E119 Type 2 diabetes mellitus without complications: Secondary | ICD-10-CM | POA: Insufficient documentation

## 2023-04-01 DIAGNOSIS — K219 Gastro-esophageal reflux disease without esophagitis: Secondary | ICD-10-CM | POA: Insufficient documentation

## 2023-04-01 LAB — CBC WITH DIFF
BASOPHIL #: 0.1 10*3/uL (ref 0.00–0.10)
BASOPHIL %: 1 % (ref 0–1)
EOSINOPHIL #: 0.6 10*3/uL — ABNORMAL HIGH (ref 0.00–0.50)
EOSINOPHIL %: 8 %
HCT: 37.1 % (ref 31.2–41.9)
HGB: 12.6 g/dL (ref 10.9–14.3)
LYMPHOCYTE #: 2.4 10*3/uL (ref 1.00–3.00)
LYMPHOCYTE %: 33 % (ref 16–44)
MCH: 29.1 pg (ref 24.7–32.8)
MCHC: 34 g/dL (ref 32.3–35.6)
MCV: 85.8 fL (ref 75.5–95.3)
MONOCYTE #: 0.6 10*3/uL (ref 0.30–1.00)
MONOCYTE %: 8 % (ref 5–13)
MPV: 8.6 fL (ref 7.9–10.8)
NEUTROPHIL #: 3.8 10*3/uL (ref 1.85–7.80)
NEUTROPHIL %: 51 % (ref 43–77)
PLATELETS: 311 10*3/uL (ref 140–440)
RBC: 4.33 10*6/uL (ref 3.63–4.92)
RDW: 13.7 % (ref 12.3–17.7)
WBC: 7.4 10*3/uL (ref 3.8–11.8)

## 2023-04-01 LAB — COMPREHENSIVE METABOLIC PNL, FASTING
ALBUMIN/GLOBULIN RATIO: 1.2 (ref 0.8–1.4)
ALBUMIN: 4 g/dL (ref 3.5–5.7)
ALKALINE PHOSPHATASE: 69 U/L (ref 34–104)
ALT (SGPT): 23 U/L (ref 7–52)
ANION GAP: 8 mmol/L (ref 4–13)
AST (SGOT): 20 U/L (ref 13–39)
BILIRUBIN TOTAL: 0.3 mg/dL (ref 0.3–1.2)
BUN/CREA RATIO: 9 (ref 6–22)
BUN: 7 mg/dL (ref 7–25)
CALCIUM, CORRECTED: 9 mg/dL (ref 8.9–10.8)
CALCIUM: 9 mg/dL (ref 8.6–10.3)
CHLORIDE: 98 mmol/L (ref 98–107)
CO2 TOTAL: 28 mmol/L (ref 21–31)
CREATININE: 0.77 mg/dL (ref 0.60–1.30)
ESTIMATED GFR: 96 mL/min/{1.73_m2} (ref >59)
GLOBULIN: 3.3 (ref 2.9–5.4)
GLUCOSE: 129 mg/dL — ABNORMAL HIGH (ref 74–109)
OSMOLALITY, CALCULATED: 268 mOsm/kg — ABNORMAL LOW (ref 270–290)
POTASSIUM: 3.6 mmol/L (ref 3.5–5.1)
PROTEIN TOTAL: 7.3 g/dL (ref 6.4–8.9)
SODIUM: 134 mmol/L — ABNORMAL LOW (ref 136–145)

## 2023-04-01 LAB — THYROID STIMULATING HORMONE (SENSITIVE TSH): TSH: 2.325 u[IU]/mL (ref 0.450–5.330)

## 2023-04-01 LAB — LIPID PANEL
CHOL/HDL RATIO: 2.6
CHOLESTEROL: 126 mg/dL (ref <200)
HDL CHOL: 48 mg/dL (ref >=40)
LDL CALC: 47 mg/dL (ref 0–100)
TRIGLYCERIDES: 154 mg/dL — ABNORMAL HIGH (ref <=150)
VLDL CALC: 31 mg/dL (ref 0–50)

## 2023-04-01 LAB — HGA1C (HEMOGLOBIN A1C WITH EST AVG GLUCOSE): HEMOGLOBIN A1C: 6.8 % — ABNORMAL HIGH (ref 4.0–6.0)

## 2023-04-01 LAB — VITAMIN D 25 TOTAL: VITAMIN D: 49 ng/mL (ref 30–100)

## 2023-04-01 MED ORDER — GABAPENTIN 300 MG CAPSULE
ORAL_CAPSULE | ORAL | 1 refills | Status: AC
Start: 2023-04-01 — End: ?

## 2023-04-01 MED ORDER — FLUCONAZOLE 100 MG TABLET
100.0000 mg | ORAL_TABLET | Freq: Every day | ORAL | 0 refills | Status: DC
Start: 2023-04-01 — End: 2023-12-09

## 2023-04-01 MED ORDER — CYCLOBENZAPRINE 10 MG TABLET
ORAL_TABLET | ORAL | 2 refills | Status: DC
Start: 2023-04-01 — End: 2023-08-05

## 2023-04-01 NOTE — Nursing Note (Signed)
4 month follow up pt states that she is still having stinging in her feet and legs and some swelling

## 2023-04-01 NOTE — Progress Notes (Unsigned)
Name: Sue Rose                       Date of Birth: 10/16/1976   MRN:  [id number]                         Date of visit: 04/01/2023     PCP: Samuella Cota, PA-C     Subjective  Sue Rose is a 47 y.o. year old female who presents for Follow Up 4 Months (4 month follow up pt states that she is still having stinging in her feet and legs and some swelling/)   to clinic.  No specialty comments available.   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 01/21/2022    Symptomatic varicose veins 01/21/2022    Thyroid nodule 01/21/2022    Type 2 diabetes mellitus without complication (CMS HCC) 01/21/2022    Vitamin D deficiency 01/21/2022      Current Outpatient Medications   Medication Sig    aspirin 81 mg Oral Tablet, Chewable Chew 1 Tablet (81 mg total) Once a day    bethanechol chloride (URECHOLINE) 5 mg Oral Tablet Take 1 Tablet (5 mg total) by mouth Three times a day    cetirizine (ZYRTEC) 10 mg Oral Tablet Take 1 Tablet (10 mg total) by mouth Once per day as needed for Allergies    cyanocobalamin (VITAMIN B12) 1,000 mcg/mL Injection Solution Inject 1 mL (1,000 mcg total) into the muscle Once a day    cyclobenzaprine (FLEXERIL) 10 mg Oral Tablet TAKE ONE TABLET (10 MG TOTAL) BY MOUTH THREE TIMES A DAY    DULoxetine (CYMBALTA DR) 30 mg Oral Capsule, Delayed Release(E.C.) Take 1 Capsule (30 mg total) by mouth Twice daily (Patient not taking: Reported on 04/01/2023)    ergocalciferol,  vitamin D2, (DRISDOL) 1,250 mcg (50,000 unit) Oral Capsule TAKE ONE CAPSULE BY MOUTH ONCE WEEKLY    escitalopram oxalate (LEXAPRO) 10 mg Oral Tablet Take 1 Tablet (10 mg total) by mouth Once a day    famotidine (PEPCID) 40 mg Oral Tablet Take 1 Tablet (40 mg total) by mouth Twice daily (Patient taking differently: Take 1 Tablet (40 mg total) by mouth Every evening)    gabapentin (NEURONTIN) 300 mg Oral Capsule TAKE ONE CAPSULE (300 MG TOTAL) BY MOUTH THREE TIMES A DAY FOR 30 DAYS    hydroCHLOROthiazide (HYDRODIURIL) 25 mg Oral Tablet Take 1 Tablet (25 mg total) by mouth Every morning    levothyroxine (SYNTHROID) 50 mcg Oral Tablet Take 1 Tablet (50 mcg total) by mouth Once a day    metFORMIN (GLUCOPHAGE) 500 mg Oral Tablet Take 1 Tablet (500 mg total) by mouth Twice daily with food Patient only takes once a day    nitroGLYCERIN (NITROSTAT) 0.4 mg Sublingual Tablet, Sublingual Place 1 Tablet (0.4 mg total) under the tongue Every 5 minutes as needed for Chest pain for 3 doses over 15 minutes    olmesartan (BENICAR) 40 mg Oral Tablet  Take 1 Tablet (40 mg total) by mouth Once a day    omeprazole (PRILOSEC) 40 mg Oral Capsule, Delayed Release(E.C.) Take 1 Capsule (40 mg total) by mouth Once a day (Patient taking differently: Take 1 Capsule (40 mg total) by mouth Twice daily)    rosuvastatin (CRESTOR) 20 mg Oral Tablet Take 1 Tablet (20 mg total) by mouth Every evening        FU Visit       This 47 yo is here for  routine  follow  up       Co painful bowel movements  and has  hemorrhoids     supp written by  Leonette Nutting  and  helps some but has to stay on them but  does  not want to use daily  Pt is to call for fu   They did talk about  possible  surgery     Fu sob   Pt was referred to and has had Work up with  Dr Renda Rolls   Holter monitor for dr Renda Rolls   and will get  results        S/p  GYN appt   Dr Danae Orleans  [facility]  saw pt and now   discussing   hyst  and  to have  US done  and fu  soon    June  25th    Ca 125  done and nl will get report   Pap  May showed  candida   and  will send in  diflucan    Will request  report       Co 5th finger  jammed  and  few  months ago       Fu [place]  pt was seeing dr Truddie Crumble but he is gone   Offered to send her  to Dr V patel   She will just continue with  CPAP and will get info from   [place] and  [facility]       FU pain in  knees and legs     seen by  Ortho Dr Cecilie Lowers   no surgery  no inj         FU  back pain and radiculopathy patient did  see neurosurgeon in [place] and basically was told nothing to do at this time       First to try  tylenol   tid     Refused intervention or gabapentin     Fu  Vericose veins   complains of leg pain undescribable feeling at times and concerned about her varicose veins   Pt went to see Dr Harvie Bridge  He wants to do procedure but  Pt canceled it      Follow-up GI issues patient was off metformin was doing some better  she is back on medication will need to monitor      FU TYPE 2 DM ON METFORMIN THAN HAD GI ISSUES  WAS OFF  AND TRIED BACK ON now  But only  500 mg one daily        INJ DISCUSSED  DUE TO  NEED FOR  CONTROL OF  BS  AND   WT LOSS NEED     TRIAL OF  TRULICITY  OZEMPIC  PT  FAILED   CO yeast with farxiga jardiance and lastly  Januvia   I reminded her that  High BS can cause  Yeast as well.    Will fu aic  This time and if elevated will have to  Either increase  Metformin or add another agent   BS discussed    Pt has been losing wt      FU HX of  COVID   pt has not felt well since having covid         FU LOW VIT D  ON SUPPLEMENT    FU NASH       [place]  + FATTY LIVER  DZ       FU WT GAIN   AND METABOLIC SYNDROME X   DISCUSSED WT LOSS MEDS  IN THE PAST     FU  GERD  PT  ON  PEPCID AND PRILOSEC    FU  HTN BP  HAS BEEN STABLE  AT  HOME       + cough on ACE   now on  BENICAR      Pt had ablation   no menses  and does get hot flashes            REVIEW OF SYSTEMS:   Review of Systems  General: No fever.  No chills.  No weight changes.  HEENT: No vision  changes.  Cardiac: No chest pain. No palpitations.  No dizziness.  No light-headedness.  No near syncope.  Resp: No dyspnea at rest, no dyspnea on exertion; no cough or hemoptysis; no orthopnea or PND.  GI: No N/V. No melena.  No bright red blood per rectum.  Ext: No edema.  No claudication.  Neuro: No focal weakness.  No numbness.  All other ROS negative.      Objective:   BP 124/84 (Site: Left, Patient Position: Sitting)   Pulse 74   Temp 36.8 C (98.3 F)   Ht 1.651 m (5\' 5" )   SpO2 98%   BMI 35.78 kg/m              PHYSICAL EXAM  Physical Exam  Gen: NAD. Alert.   HEENT: PERRL; conjunctivae clear. No JVD or carotid bruit.  Cardiac: RRR with normal S1, S2.   Lungs: Clear to auscultation bilaterally. No rales. No wheezing. No rhonchi.  Abdomen: Soft, non-tender.non-distended  nl bowel sounds    Extremities: No edema. No cyanosis. No clubbing.  Neurologic:  Grossly intact    Lab Results   Component Value Date    CHOLESTEROL 127 11/20/2022    HDLCHOL 53 11/20/2022    LDLCHOL 50 11/20/2022    TRIG 121 11/20/2022       COMPLETE BLOOD COUNT   Lab Results   Component Value Date    WBC 7.6 11/20/2022    HGB 12.7 11/20/2022    HCT 37.0 11/20/2022    PLTCNT 349 11/20/2022       DIFFERENTIAL  Lab Results   Component Value Date    PMNS 48 11/20/2022    LYMPHOCYTES 37 11/20/2022    MONOCYTES 7 11/20/2022    EOSINOPHIL 7 11/20/2022    BASOPHILS 1 11/20/2022    BASOPHILS 0.10 11/20/2022    PMNABS 3.70 11/20/2022    LYMPHSABS 2.80 11/20/2022    EOSABS 0.50 11/20/2022    MONOSABS 0.50 11/20/2022        COMPREHENSIVE METABOLIC PANEL   Lab Results   Component Value Date    SODIUM 134 (L) 02/17/2023    POTASSIUM 4.2 02/17/2023    CHLORIDE 98 02/17/2023    CO2 29 02/17/2023    ANIONGAP 7 02/17/2023    BUN 7 02/17/2023  CREATININE 0.80 02/17/2023    GLUCOSENF 101 02/17/2023    CALCIUM 9.7 02/17/2023    ALBUMIN 4.3 11/20/2022    TOTALPROTEIN 7.3 11/20/2022    ALKPHOS 69 11/20/2022    AST 16 11/20/2022    ALT 18 11/20/2022             THYROID STIMULATING HORMONE  Lab Results   Component Value Date    TSH 3.431 11/20/2022        Lab Results   Component Value Date    HA1C 6.3 (H) 11/20/2022     Lab Results   Component Value Date    VITD 63 11/20/2022         Assessment/Plan  Problem List Items Addressed This Visit    None       No orders of the defined types were placed in this encounter.       Meds reviewed as well as labs.  Chart reviewed and updated.   Continue current treatment.  Keep follow-up appointment.   Vaccine hx reviewed.   Referral to dr blaydes/[facility]  as pt was seen there in the past  and  was told + cataract     Seen by Neurosurgery  not planning  to having  injections   but has  PT order   not sure if she will go    To fu with  Dr Renda Rolls and  Holter monitor    Handicap form  filled out but I did encouraged   Dr Truddie Crumble  was OSA dx  and on  CPAP    [facility]   will get  notes and study and contact [facility]   to resume  care of this   Continue  current  tx with gabapentin   uds up todate   Handicap  paper filled    out

## 2023-04-03 ENCOUNTER — Encounter (INDEPENDENT_AMBULATORY_CARE_PROVIDER_SITE_OTHER): Payer: Self-pay | Admitting: Internal Medicine

## 2023-04-04 ENCOUNTER — Other Ambulatory Visit (RURAL_HEALTH_CENTER): Payer: Self-pay

## 2023-04-04 DIAGNOSIS — Z1231 Encounter for screening mammogram for malignant neoplasm of breast: Secondary | ICD-10-CM

## 2023-04-21 ENCOUNTER — Telehealth (INDEPENDENT_AMBULATORY_CARE_PROVIDER_SITE_OTHER): Payer: Self-pay | Admitting: Internal Medicine

## 2023-05-21 ENCOUNTER — Other Ambulatory Visit (HOSPITAL_COMMUNITY): Payer: Self-pay | Admitting: Family

## 2023-05-21 DIAGNOSIS — K760 Fatty (change of) liver, not elsewhere classified: Secondary | ICD-10-CM

## 2023-06-12 ENCOUNTER — Other Ambulatory Visit (RURAL_HEALTH_CENTER): Payer: Self-pay | Admitting: Internal Medicine

## 2023-07-10 ENCOUNTER — Other Ambulatory Visit (INDEPENDENT_AMBULATORY_CARE_PROVIDER_SITE_OTHER): Payer: Self-pay | Admitting: Internal Medicine

## 2023-07-16 ENCOUNTER — Inpatient Hospital Stay
Admission: RE | Admit: 2023-07-16 | Discharge: 2023-07-16 | Disposition: A | Discharge status: Home / Self Care | Payer: 59 | Source: Ambulatory Visit | Attending: Family | Admitting: Family

## 2023-07-16 ENCOUNTER — Other Ambulatory Visit: Payer: Self-pay

## 2023-07-16 DIAGNOSIS — K760 Fatty (change of) liver, not elsewhere classified: Secondary | ICD-10-CM | POA: Insufficient documentation

## 2023-07-21 ENCOUNTER — Encounter (INDEPENDENT_AMBULATORY_CARE_PROVIDER_SITE_OTHER): Payer: Self-pay | Admitting: Internal Medicine

## 2023-07-30 ENCOUNTER — Other Ambulatory Visit (INDEPENDENT_AMBULATORY_CARE_PROVIDER_SITE_OTHER): Payer: Self-pay | Admitting: Internal Medicine

## 2023-08-05 ENCOUNTER — Ambulatory Visit: Payer: 59 | Attending: Internal Medicine | Admitting: Internal Medicine

## 2023-08-05 ENCOUNTER — Encounter (INDEPENDENT_AMBULATORY_CARE_PROVIDER_SITE_OTHER): Payer: Self-pay | Admitting: Internal Medicine

## 2023-08-05 ENCOUNTER — Other Ambulatory Visit: Payer: Self-pay

## 2023-08-05 VITALS — BP 120/80 | HR 90 | Temp 98.1°F | Ht 65.0 in | Wt 236.0 lb

## 2023-08-05 DIAGNOSIS — I10 Essential (primary) hypertension: Secondary | ICD-10-CM | POA: Insufficient documentation

## 2023-08-05 DIAGNOSIS — N390 Urinary tract infection, site not specified: Secondary | ICD-10-CM | POA: Insufficient documentation

## 2023-08-05 DIAGNOSIS — K219 Gastro-esophageal reflux disease without esophagitis: Secondary | ICD-10-CM | POA: Insufficient documentation

## 2023-08-05 DIAGNOSIS — E119 Type 2 diabetes mellitus without complications: Secondary | ICD-10-CM | POA: Insufficient documentation

## 2023-08-05 DIAGNOSIS — E039 Hypothyroidism, unspecified: Secondary | ICD-10-CM | POA: Insufficient documentation

## 2023-08-05 DIAGNOSIS — E559 Vitamin D deficiency, unspecified: Secondary | ICD-10-CM | POA: Insufficient documentation

## 2023-08-05 DIAGNOSIS — Z7984 Long term (current) use of oral hypoglycemic drugs: Secondary | ICD-10-CM

## 2023-08-05 DIAGNOSIS — Z0000 Encounter for general adult medical examination without abnormal findings: Secondary | ICD-10-CM | POA: Insufficient documentation

## 2023-08-05 DIAGNOSIS — K7581 Nonalcoholic steatohepatitis (NASH): Secondary | ICD-10-CM | POA: Insufficient documentation

## 2023-08-05 LAB — POCT URINE DIPSTICK
BILIRUBIN: NEGATIVE
BLOOD: NEGATIVE
GLUCOSE: NEGATIVE
KETONE: NEGATIVE
LEUKOCYTES: NEGATIVE
NITRITE: NEGATIVE
PH: 5
PROTEIN: NEGATIVE
SPECIFIC GRAVITY: 1.015
UROBILINOGEN: 0.2

## 2023-08-05 MED ORDER — CETIRIZINE 10 MG TABLET: 10.0000 mg | ORAL_TABLET | Freq: Every day | ORAL | 2 refills | Status: DC | PRN

## 2023-08-05 MED ORDER — SUCRALFATE 1 GRAM TABLET
1.0000 g | ORAL_TABLET | Freq: Four times a day (QID) | ORAL | 2 refills | Status: AC
Start: 2023-08-05 — End: ?

## 2023-08-05 MED ORDER — HYDROCHLOROTHIAZIDE 25 MG TABLET: 25.0000 mg | ORAL_TABLET | Freq: Every morning | ORAL | 2 refills | Status: DC

## 2023-08-05 MED ORDER — FAMOTIDINE 40 MG TABLET: 40.0000 mg | ORAL_TABLET | Freq: Two times a day (BID) | ORAL | 3 refills | Status: DC

## 2023-08-05 MED ORDER — OMEPRAZOLE 40 MG CAPSULE,DELAYED RELEASE
40.0000 mg | DELAYED_RELEASE_CAPSULE | Freq: Two times a day (BID) | ORAL | 2 refills | Status: DC
Start: 2023-08-05 — End: 2024-06-17

## 2023-08-05 MED ORDER — ESCITALOPRAM 10 MG TABLET
10.0000 mg | ORAL_TABLET | Freq: Every day | ORAL | 2 refills | Status: DC
Start: 2023-08-05 — End: 2023-12-09

## 2023-08-05 MED ORDER — METFORMIN 500 MG TABLET
500.0000 mg | ORAL_TABLET | Freq: Two times a day (BID) | ORAL | 2 refills | Status: DC
Start: 2023-08-05 — End: 2023-12-16

## 2023-08-05 MED ORDER — CYCLOBENZAPRINE 10 MG TABLET
ORAL_TABLET | ORAL | 2 refills | Status: DC
Start: 2023-08-05 — End: 2023-12-24

## 2023-08-05 MED ORDER — OLMESARTAN 40 MG TABLET: 40.0000 mg | ORAL_TABLET | Freq: Every day | ORAL | 2 refills | Status: DC

## 2023-08-05 MED ORDER — LEVOTHYROXINE 50 MCG TABLET
50.0000 ug | ORAL_TABLET | Freq: Every day | ORAL | 2 refills | Status: DC
Start: 2023-08-05 — End: 2024-06-17

## 2023-08-05 MED ORDER — ERGOCALCIFEROL (VITAMIN D2) 1,250 MCG (50,000 UNIT) CAPSULE
50000.0000 [IU] | ORAL_CAPSULE | ORAL | 2 refills | Status: DC
Start: 2023-08-05 — End: 2024-06-17

## 2023-08-05 MED ORDER — ROSUVASTATIN 20 MG TABLET
20.0000 mg | ORAL_TABLET | Freq: Every evening | ORAL | 2 refills | Status: DC
Start: 2023-08-05 — End: 2024-07-15

## 2023-08-05 NOTE — Nursing Note (Signed)
08/05/23 1400   Urine test  ([employer] Multistix 10 SG)   Performed Status: Manual   Time collected 1457   Color (Ref Range: Yellow) Yellow   Clarity (Ref Range: Clear) Clear   Glucose (Ref Range: Negative mg/dL) Negative   Bilirubin (Ref Range: Negative mg/dL) Negative   Ketones (Ref Range: Negative mg/dL) Negative   Urine Specific Gravity (Ref Range: 1.005 - 1.030) 1.015   Blood (urine) (Ref Range: Negative mg/dL) Negative   pH (Ref Range: 5.0 - 8.0) 5.0   Protein (Ref Range: Negative mg/dL) Negative   Urobilinogen (Ref Range: Normal) 0.2mg /dL (Normal)   Nitrite (Ref Range: Negative) Negative   Leukocytes (Ref Range: Negative WBC's/uL) Negative

## 2023-08-05 NOTE — Nursing Note (Signed)
Annual wellness pt thinks she has UTI having discomfort and frequent urination going on for a few days now

## 2023-08-05 NOTE — Progress Notes (Signed)
INTERNAL MEDICINE, Sue Rose A  [address]  [place] [address]   Operated by [facility]     Name: Sue Rose                       Date of Birth: [birth year]   MRN:  [id number]                         Date of visit: 08/05/2023     PCP: Samuella Cota, PA-C     Subjective  Sue Rose is a 47 y.o. year old female who presents for Annual Wellness Exam (Annual wellness pt thinks she has UTI having discomfort and frequent urination going on for a few days now)   to clinic.  No specialty comments available.   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 01/21/2022    Symptomatic varicose veins 01/21/2022    Thyroid nodule 01/21/2022    Type 2 diabetes mellitus without complication (CMS HCC) 01/21/2022    Vitamin D deficiency 01/21/2022      Current Outpatient Medications   Medication Sig    aspirin 81 mg Oral Tablet, Chewable Chew 1 Tablet (81 mg total) Once a day    bethanechol chloride (URECHOLINE) 5 mg Oral Tablet Take 1 Tablet (5 mg total) by mouth Three times a day    cetirizine (ZYRTEC) 10 mg Oral Tablet Take 1 Tablet (10 mg total) by mouth Once per day as needed for Allergies    cyanocobalamin (VITAMIN B12) 1,000 mcg/mL Injection Solution Inject 1 mL (1,000 mcg total) into the muscle Once a day    cyclobenzaprine (FLEXERIL) 10 mg Oral Tablet TAKE ONE TABLET (10 MG TOTAL) BY MOUTH THREE TIMES A DAY    DULoxetine (CYMBALTA DR) 30 mg Oral Capsule, Delayed Release(E.C.) Take 1 Capsule (30 mg total) by mouth Twice daily (Patient not  taking: Reported on 08/05/2023)    ergocalciferol, vitamin D2, (DRISDOL) 1,250 mcg (50,000 unit) Oral Capsule TAKE ONE CAPSULE BY MOUTH ONCE WEEKLY    escitalopram oxalate (LEXAPRO) 10 mg Oral Tablet TAKE ONE TABLET (10 MG TOTAL) BY MOUTH ONCE A DAY (TAKE ONE HALF TABLET DAILY FOR ONE WEEK THEN ONE TABLET DAILY)    famotidine (PEPCID) 40 mg Oral Tablet Take 1 Tablet (40 mg total) by mouth Twice daily (Patient taking differently: Take 1 Tablet (40 mg total) by mouth Every evening)    fluconazole (DIFLUCAN) 100 mg Oral Tablet Take 1 Tablet (100 mg total) by mouth Once a day (Patient not taking: Reported on 08/05/2023)    gabapentin (NEURONTIN) 300 mg Oral Capsule TAKE ONE CAPSULE (300 MG TOTAL) BY MOUTH THREE TIMES A DAY FOR 30 DAYS    hydroCHLOROthiazide (HYDRODIURIL) 25 mg Oral Tablet Take 1 Tablet (25 mg total) by mouth Every morning    levothyroxine (SYNTHROID) 50 mcg Oral Tablet Take 1 Tablet (50 mcg total) by mouth Once a day    metFORMIN (GLUCOPHAGE) 500 mg Oral Tablet Take  1 Tablet (500 mg total) by mouth Twice daily with food Patient only takes once a day    nitroGLYCERIN (NITROSTAT) 0.4 mg Sublingual Tablet, Sublingual Place 1 Tablet (0.4 mg total) under the tongue Every 5 minutes as needed for Chest pain for 3 doses over 15 minutes    olmesartan (BENICAR) 40 mg Oral Tablet Take 1 Tablet (40 mg total) by mouth Once a day    omeprazole (PRILOSEC) 40 mg Oral Capsule, Delayed Release(E.C.) Take 1 Capsule (40 mg total) by mouth Once a day (Patient taking differently: Take 1 Capsule (40 mg total) by mouth Twice daily)    rosuvastatin (CRESTOR) 20 mg Oral Tablet Take 1 Tablet (20 mg total) by mouth Every evening      HPI  This 47 yo is here  for annual exam     REVIEW OF SYSTEMS:   Review of Systems  Review of Systems have been reviewed  ROS  Const  Reports system reviewed and no additional complaints, except as documented  ENT  Reports system reviewed and no additional complaints, except as  documented  Resp  Reports system reviewed and no additional complaints, except as documented  GI  Reports system reviewed and no additional complaints, except as documented     Objective:   BP 120/80 (Site: Left Arm, Patient Position: Sitting)   Pulse 90   Temp 36.7 C (98.1 F)   Ht 1.651 m (5\' 5" )   Wt 107 kg (236 lb)   SpO2 95%   BMI 39.27 kg/m              PHYSICAL EXAM  Physical Exam  Gen: NAD. Alert.   Exam-AMB  Const  General: cooperative, healthy appearing and no acute distress  Orientation/Consciousness: patient oriented x3  HENMT  Ears: hearing grossly normal bilaterally  Eyes  General: appearance normal, both eyes and all related structures  Conjunctivae: conjunctivae normal  Sclera: sclerae normal  EOM: EOM intact bilaterally  Neck  Neck: normal visual inspection and no lymphadenopathy  Thyroid: thyroid normal  Carotids: no bruits  Resp  Effort & Inspection: normal respiratory effort  Auscultation: clear to auscultation bilaterally, no crackles, no rales, no rhonchi and no wheezes  Cardio  Jugular venous pressure: no JVD  Rate: regular rate  Rhythm: regular rhythm  Heart Sounds: S1 normal, S2 normal, no click, no murmurs and no rubs  Bruits: no carotid bruits  GI  Inspection: Yes normal to inspection  Palpation: soft, no hepatosplenomegaly, no guarding, no masses and nontender  Auscultation: normal bowel sounds  Neuro  General: patient oriented x3  Extrem  General: normal to inspection, normal exam except as noted, clubbing, cyanosis or edema noted, normal gait and other  Psych  Mental Status: mental status grossly normal  Mood: congruent mood  Affect: normal affect  Insight: insight good  Judgment: judgment good       Assessment/Plan  Assessment/Plan   1. Encounter for annual health examination      Meds reviewed as well as labs.  Chart reviewed and updated.   Continue current treatment.  Keep follow-up appointment.   Vaccine hx reviewed.    Labs and all medications  reviewed and  renewed     Pt  to fu  with cardiology and  they are talking about another heart  cath

## 2023-08-06 LAB — CBC WITH DIFF
BASOPHIL #: 0.1 10*3/uL (ref 0.00–0.10)
BASOPHIL %: 1 % (ref 0–1)
EOSINOPHIL #: 0.9 10*3/uL — ABNORMAL HIGH (ref 0.00–0.50)
EOSINOPHIL %: 10 % — ABNORMAL HIGH (ref 1–7)
HCT: 38.8 % (ref 31.2–41.9)
HGB: 13.1 g/dL (ref 10.9–14.3)
LYMPHOCYTE #: 2.6 10*3/uL (ref 1.00–3.00)
LYMPHOCYTE %: 28 % (ref 16–44)
MCH: 28.6 pg (ref 24.7–32.8)
MCHC: 33.8 g/dL (ref 32.3–35.6)
MCV: 84.7 fL (ref 75.5–95.3)
MONOCYTE #: 0.8 10*3/uL (ref 0.30–1.00)
MONOCYTE %: 9 % (ref 5–13)
MPV: 9 fL (ref 7.9–10.8)
NEUTROPHIL #: 4.9 10*3/uL (ref 1.85–7.80)
NEUTROPHIL %: 53 % (ref 43–77)
PLATELETS: 307 10*3/uL (ref 140–440)
RBC: 4.58 10*6/uL (ref 3.63–4.92)
RDW: 14.2 % (ref 12.3–17.7)
WBC: 9.2 10*3/uL (ref 3.8–11.8)

## 2023-08-06 LAB — COMPREHENSIVE METABOLIC PNL, FASTING
ALBUMIN/GLOBULIN RATIO: 1.1 (ref 0.8–1.4)
ALBUMIN: 4 g/dL (ref 3.5–5.7)
ALKALINE PHOSPHATASE: 71 U/L (ref 34–104)
ALT (SGPT): 29 U/L (ref 7–52)
ANION GAP: 6 mmol/L (ref 4–13)
AST (SGOT): 33 U/L (ref 13–39)
BILIRUBIN TOTAL: 0.3 mg/dL (ref 0.3–1.0)
BUN/CREA RATIO: 8 (ref 6–22)
BUN: 6 mg/dL — ABNORMAL LOW (ref 7–25)
CALCIUM, CORRECTED: 9.2 mg/dL (ref 8.9–10.8)
CALCIUM: 9.2 mg/dL (ref 8.6–10.3)
CHLORIDE: 99 mmol/L (ref 98–107)
CO2 TOTAL: 27 mmol/L (ref 21–31)
CREATININE: 0.73 mg/dL (ref 0.60–1.30)
ESTIMATED GFR: 101 mL/min/{1.73_m2} (ref >59)
GLOBULIN: 3.7 (ref 2.9–5.4)
GLUCOSE: 119 mg/dL — ABNORMAL HIGH (ref 74–109)
OSMOLALITY, CALCULATED: 263 mosm/kg — ABNORMAL LOW (ref 270–290)
POTASSIUM: 3.8 mmol/L (ref 3.5–5.1)
PROTEIN TOTAL: 7.7 g/dL (ref 6.4–8.9)
SODIUM: 132 mmol/L — ABNORMAL LOW (ref 136–145)

## 2023-08-06 LAB — LIPID PANEL
CHOL/HDL RATIO: 2.7
CHOLESTEROL: 124 mg/dL (ref <200)
HDL CHOL: 46 mg/dL (ref >=40)
LDL CALC: 39 mg/dL (ref 0–100)
TRIGLYCERIDES: 197 mg/dL — ABNORMAL HIGH (ref <=150)
VLDL CALC: 39 mg/dL (ref 0–50)

## 2023-08-06 LAB — VITAMIN D 25 TOTAL: VITAMIN D 25, TOTAL: 68.69 ng/mL (ref 30.00–100.00)

## 2023-08-06 LAB — THYROID STIMULATING HORMONE (SENSITIVE TSH): TSH: 3.805 u[IU]/mL (ref 0.450–5.330)

## 2023-08-06 LAB — HGA1C (HEMOGLOBIN A1C WITH EST AVG GLUCOSE): HEMOGLOBIN A1C: 6.8 % — ABNORMAL HIGH (ref 4.0–6.0)

## 2023-08-12 ENCOUNTER — Ambulatory Visit: Payer: 59 | Attending: Internal Medicine | Admitting: Internal Medicine

## 2023-08-12 ENCOUNTER — Other Ambulatory Visit: Payer: Self-pay

## 2023-08-12 ENCOUNTER — Telehealth (INDEPENDENT_AMBULATORY_CARE_PROVIDER_SITE_OTHER): Payer: Self-pay | Admitting: Internal Medicine

## 2023-08-12 ENCOUNTER — Encounter (INDEPENDENT_AMBULATORY_CARE_PROVIDER_SITE_OTHER): Payer: Self-pay | Admitting: Internal Medicine

## 2023-08-12 VITALS — HR 96 | Temp 98.3°F

## 2023-08-12 DIAGNOSIS — W57XXXA Bitten or stung by nonvenomous insect and other nonvenomous arthropods, initial encounter: Secondary | ICD-10-CM | POA: Insufficient documentation

## 2023-08-12 DIAGNOSIS — S30861A Insect bite (nonvenomous) of abdominal wall, initial encounter: Secondary | ICD-10-CM | POA: Insufficient documentation

## 2023-08-12 DIAGNOSIS — Z1239 Encounter for other screening for malignant neoplasm of breast: Secondary | ICD-10-CM

## 2023-08-12 MED ORDER — DOXYCYCLINE HYCLATE 100 MG TABLET
100.0000 mg | ORAL_TABLET | Freq: Two times a day (BID) | ORAL | 0 refills | Status: DC
Start: 2023-08-12 — End: 2023-08-12

## 2023-08-12 MED ORDER — DOXYCYCLINE HYCLATE 100 MG TABLET
100.0000 mg | ORAL_TABLET | Freq: Two times a day (BID) | ORAL | 0 refills | Status: DC
Start: 2023-08-12 — End: 2023-08-20

## 2023-08-12 NOTE — Nursing Note (Signed)
Pt states that she pulled a tick off of her groin area noticed it yesterday when she showered states that it was not hard to pull off did not seem to be stuck on states that she does not have any symptoms just wanted to get checked out and get antibiotics.pt states that she does have a place that is red looks like a bullseye

## 2023-08-12 NOTE — Progress Notes (Unsigned)
[facility]ICINE, [facility]ING [facility]  [address]  [place] [address]  Operated by [facility]  Progress Note    Name: Sue Rose MRN:  [id number]   Date: 08/12/2023 DOB:  1976-02-10 (47 y.o.)              Chief Complaint: Tick Bite (Pt states that she pulled a tick off of her groin area noticed it yesterday when she showered states that it was not hard to pull off did not seem to be stuck on states that she does not have any symptoms just wanted to get checked out and get antibiotics.pt states that she does have a place that is red looks like a bullseye)       HPI: Sue Rose is a 47 y.o. female who complains of  yesterday  she found something on right groin  pt  realized it  was a tick bite and  she  says now she has a place right  groin and was worried it was a harold patch   no other symptoms       Mammogram due    Allergies:  Allergies   Allergen Reactions    Ace Inhibitors  Other Adverse Reaction (Add comment)     ocugh       aspirin 81 mg Oral Tablet, Chewable, Chew 1 Tablet (81 mg total) Once a day  bethanechol chloride (URECHOLINE) 5 mg Oral Tablet, Take 1 Tablet (5 mg total) by mouth Three times a day  cetirizine (ZYRTEC) 10 mg Oral Tablet, Take 1 Tablet (10 mg total) by mouth Once per day as needed for Allergies  cyanocobalamin (VITAMIN B12) 1,000 mcg/mL Injection Solution, Inject 1 mL (1,000 mcg total) into the muscle Once a day  cyclobenzaprine (FLEXERIL) 10 mg Oral Tablet, TAKE ONE TABLET (10 MG TOTAL) BY MOUTH THREE TIMES A DAY  DULoxetine (CYMBALTA DR) 30 mg Oral Capsule, Delayed Release(E.C.), Take 1 Capsule (30 mg total) by mouth Twice daily (Patient not taking: Reported on 08/05/2023)  ergocalciferol, vitamin D2, (DRISDOL) 1,250 mcg (50,000 unit) Oral Capsule, Take 1 Capsule (50,000 Units total) by mouth Every 7 days  escitalopram oxalate (LEXAPRO) 10 mg Oral Tablet, Take 1 Tablet (10 mg total) by mouth Once a day  famotidine (PEPCID) 40 mg Oral Tablet, Take 1 Tablet (40  mg total) by mouth Twice daily  fluconazole (DIFLUCAN) 100 mg Oral Tablet, Take 1 Tablet (100 mg total) by mouth Once a day (Patient not taking: Reported on 08/05/2023)  gabapentin (NEURONTIN) 300 mg Oral Capsule, TAKE ONE CAPSULE (300 MG TOTAL) BY MOUTH THREE TIMES A DAY FOR 30 DAYS  hydroCHLOROthiazide (HYDRODIURIL) 25 mg Oral Tablet, Take 1 Tablet (25 mg total) by mouth Every morning  levothyroxine (SYNTHROID) 50 mcg Oral Tablet, Take 1 Tablet (50 mcg total) by mouth Once a day  metFORMIN (GLUCOPHAGE) 500 mg Oral Tablet, Take 1 Tablet (500 mg total) by mouth Twice daily with food Patient only takes once a day  nitroGLYCERIN (NITROSTAT) 0.4 mg Sublingual Tablet, Sublingual, Place 1 Tablet (0.4 mg total) under the tongue Every 5 minutes as needed for Chest pain for 3 doses over 15 minutes  olmesartan (BENICAR) 40 mg Oral Tablet, Take 1 Tablet (40 mg total) by mouth Once a day  omeprazole (PRILOSEC) 40 mg Oral Capsule, Delayed Release(E.C.), Take 1 Capsule (40 mg total) by mouth Twice daily  rosuvastatin (CRESTOR) 20 mg Oral Tablet, Take 1 Tablet (20 mg total) by mouth Every evening  sucralfate (CARAFATE)  1 gram Oral Tablet, Take 1 Tablet (1 g total) by mouth Every 6 hours    No facility-administered medications prior to visit.       Review of Systems -     OBJECTIVE:  Vitals:    08/12/23 1519   Pulse: 96   Temp: 36.8 C (98.3 F)   SpO2: 95%      Physical Exam   Alert and oriented   Right  groin  bruised in circular fashion approx  1 cm            ASSESSMENT:     ICD-10-CM    1. Tick bite  W57.XXXA LYME ANTIBODY PANEL WITH REFLEX      2. Encounter for screening for malignant neoplasm of breast, unspecified screening modality  Z12.39 MAMMO BILATERAL SCREENING-ADDL VIEWS/BREAST US AS REQ BY RAD          Orders Placed This Encounter    MAMMO BILATERAL SCREENING-ADDL VIEWS/BREAST US AS REQ BY RAD    LYME ANTIBODY PANEL WITH REFLEX    doxycycline 100 mg Oral Tablet        PLAN: Treatment per orders . Call or return to  clinic prn if these symptoms worsen or fail to improve as anticipated.  Meds reviewed as well as labs.  Start doxy  100 bid  x 10 days   Labs  drawn   Mammogram  will be scheduled   Samuella Cota, PA-C

## 2023-08-14 LAB — LYME ANTIBODY PANEL WITH REFLEX: LYME ANTIBODY TOTAL (Screen): NEGATIVE

## 2023-08-15 ENCOUNTER — Other Ambulatory Visit (INDEPENDENT_AMBULATORY_CARE_PROVIDER_SITE_OTHER): Payer: Self-pay | Admitting: PHYSICIAN ASSISTANT

## 2023-08-15 ENCOUNTER — Telehealth (INDEPENDENT_AMBULATORY_CARE_PROVIDER_SITE_OTHER): Payer: Self-pay | Admitting: INTERVENTIONAL CARDIOLOGY

## 2023-08-15 DIAGNOSIS — I2510 Atherosclerotic heart disease of native coronary artery without angina pectoris: Secondary | ICD-10-CM

## 2023-08-15 NOTE — Telephone Encounter (Signed)
Pt called and scheduled Heart Cath for Oct.23,2024 at [facility]. Cath lab will call day before to let her know what time to be there, also needs a driver for that day. Per Stark Bray PAC pt needs to start taking Metoprolol succinate 12.5 mg 1 tab po daily, called into [facility]. Pt voiced understanding of all above info.

## 2023-08-20 ENCOUNTER — Telehealth (INDEPENDENT_AMBULATORY_CARE_PROVIDER_SITE_OTHER): Payer: Self-pay | Admitting: Internal Medicine

## 2023-08-20 ENCOUNTER — Other Ambulatory Visit: Payer: Self-pay

## 2023-08-20 ENCOUNTER — Encounter (HOSPITAL_COMMUNITY)
Admission: RE | Disposition: A | Discharge status: Home / Self Care | Payer: Self-pay | Source: Ambulatory Visit | Attending: INTERVENTIONAL CARDIOLOGY

## 2023-08-20 ENCOUNTER — Inpatient Hospital Stay
Admission: RE | Admit: 2023-08-20 | Discharge: 2023-08-20 | Disposition: A | Discharge status: Home / Self Care | Payer: 59 | Source: Ambulatory Visit | Attending: INTERVENTIONAL CARDIOLOGY | Admitting: INTERVENTIONAL CARDIOLOGY

## 2023-08-20 DIAGNOSIS — I5020 Unspecified systolic (congestive) heart failure: Secondary | ICD-10-CM | POA: Insufficient documentation

## 2023-08-20 DIAGNOSIS — E669 Obesity, unspecified: Secondary | ICD-10-CM | POA: Insufficient documentation

## 2023-08-20 DIAGNOSIS — I509 Heart failure, unspecified: Secondary | ICD-10-CM

## 2023-08-20 DIAGNOSIS — Z7984 Long term (current) use of oral hypoglycemic drugs: Secondary | ICD-10-CM | POA: Insufficient documentation

## 2023-08-20 DIAGNOSIS — Z8249 Family history of ischemic heart disease and other diseases of the circulatory system: Secondary | ICD-10-CM | POA: Insufficient documentation

## 2023-08-20 DIAGNOSIS — I110 Hypertensive heart disease with heart failure: Secondary | ICD-10-CM | POA: Insufficient documentation

## 2023-08-20 DIAGNOSIS — Z6838 Body mass index (BMI) 38.0-38.9, adult: Secondary | ICD-10-CM | POA: Insufficient documentation

## 2023-08-20 DIAGNOSIS — E119 Type 2 diabetes mellitus without complications: Secondary | ICD-10-CM | POA: Insufficient documentation

## 2023-08-20 DIAGNOSIS — E785 Hyperlipidemia, unspecified: Secondary | ICD-10-CM | POA: Insufficient documentation

## 2023-08-20 DIAGNOSIS — I10 Essential (primary) hypertension: Secondary | ICD-10-CM

## 2023-08-20 DIAGNOSIS — I2510 Atherosclerotic heart disease of native coronary artery without angina pectoris: Secondary | ICD-10-CM | POA: Insufficient documentation

## 2023-08-20 LAB — BASIC METABOLIC PANEL
ANION GAP: 6 mmol/L (ref 4–13)
BUN/CREA RATIO: 10 (ref 6–22)
BUN: 8 mg/dL (ref 7–25)
CALCIUM: 9.3 mg/dL (ref 8.6–10.3)
CHLORIDE: 98 mmol/L (ref 98–107)
CO2 TOTAL: 30 mmol/L (ref 21–31)
CREATININE: 0.78 mg/dL (ref 0.60–1.30)
ESTIMATED GFR: 94 mL/min/{1.73_m2} (ref >59)
GLUCOSE: 154 mg/dL — ABNORMAL HIGH (ref 74–109)
OSMOLALITY, CALCULATED: 270 mosm/kg (ref 270–290)
POTASSIUM: 3.5 mmol/L (ref 3.5–5.1)
SODIUM: 134 mmol/L — ABNORMAL LOW (ref 136–145)

## 2023-08-20 LAB — CBC WITH DIFF
BASOPHIL #: 0.1 10*3/uL (ref 0.00–0.10)
BASOPHIL %: 1 % (ref 0–1)
EOSINOPHIL #: 0.8 10*3/uL — ABNORMAL HIGH (ref 0.00–0.50)
EOSINOPHIL %: 12 % — ABNORMAL HIGH (ref 1–7)
HCT: 38.5 % (ref 31.2–41.9)
HGB: 13 g/dL (ref 10.9–14.3)
LYMPHOCYTE #: 2.2 10*3/uL (ref 1.00–3.00)
LYMPHOCYTE %: 30 % (ref 16–44)
MCH: 28.6 pg (ref 24.7–32.8)
MCHC: 33.8 g/dL (ref 32.3–35.6)
MCV: 84.6 fL (ref 75.5–95.3)
MONOCYTE #: 0.5 10*3/uL (ref 0.30–1.00)
MONOCYTE %: 7 % (ref 5–13)
MPV: 8 fL (ref 7.9–10.8)
NEUTROPHIL #: 3.7 10*3/uL (ref 1.85–7.80)
NEUTROPHIL %: 50 % (ref 43–77)
PLATELETS: 271 10*3/uL (ref 140–440)
RBC: 4.55 10*6/uL (ref 3.63–4.92)
RDW: 14.1 % (ref 12.3–17.7)
WBC: 7.3 10*3/uL (ref 3.8–11.8)

## 2023-08-20 LAB — ECG 12 LEAD
Atrial Rate: 69 {beats}/min
Calculated P Axis: 48 degrees
Calculated R Axis: 39 degrees
Calculated T Axis: 44 degrees
PR Interval: 158 ms
QRS Duration: 84 ms
QT Interval: 424 ms
QTC Calculation: 454 ms
Ventricular rate: 69 {beats}/min

## 2023-08-20 LAB — PTT (PARTIAL THROMBOPLASTIN TIME): APTT: 31.4 s (ref 25.0–38.0)

## 2023-08-20 LAB — MAGNESIUM: MAGNESIUM: 1.7 mg/dL — ABNORMAL LOW (ref 1.9–2.7)

## 2023-08-20 LAB — PT/INR
INR: 1 (ref 0.84–1.10)
PROTHROMBIN TIME: 11.7 s (ref 9.8–12.7)

## 2023-08-20 LAB — HCG, SERUM QUALITATIVE, PREGNANCY: PREGNANCY, SERUM QUALITATIVE: NEGATIVE

## 2023-08-20 SURGERY — CORONARY ANGIOGRAPHY W/LEFT HEART CATH W/WO LVG
Anesthesia: IV Sedation (Nurse Monitored)

## 2023-08-20 MED ORDER — LIDOCAINE HCL 10 MG/ML (1 %) INJECTION SOLUTION
Freq: Once | INTRAMUSCULAR | Status: DC | PRN
Start: 2023-08-20 — End: 2023-08-20
  Administered 2023-08-20: 5 mL via INTRADERMAL

## 2023-08-20 MED ORDER — BIVALIRUDIN 250 MG INTRAVENOUS POWDER FOR SOLUTION
INTRAVENOUS | Status: AC
Start: 2023-08-20 — End: 2023-08-20
  Filled 2023-08-20: qty 5

## 2023-08-20 MED ORDER — MIDAZOLAM 5 MG/ML INJECTION WRAPPER
Freq: Once | INTRAMUSCULAR | Status: DC | PRN
Start: 2023-08-20 — End: 2023-08-20
  Administered 2023-08-20 (×2): 1 mg via INTRAVENOUS

## 2023-08-20 MED ORDER — MAGNESIUM SULFATE 1 GRAM/100 ML IN DEXTROSE 5 % INTRAVENOUS PIGGYBACK
1.0000 g | INJECTION | INTRAVENOUS | Status: AC
Start: 2023-08-20 — End: 2023-08-20
  Administered 2023-08-20: 0 g via INTRAVENOUS
  Administered 2023-08-20: 1 g via INTRAVENOUS

## 2023-08-20 MED ORDER — HEPARIN (PORCINE) 1,000 UNIT/ML INJECTION SOLUTION
Freq: Once | INTRAMUSCULAR | Status: DC | PRN
Start: 2023-08-20 — End: 2023-08-20
  Administered 2023-08-20: 5200 [IU] via INTRA_ARTERIAL

## 2023-08-20 MED ORDER — FENTANYL (PF) 50 MCG/ML INJECTION WRAPPER
INJECTION | Freq: Once | INTRAMUSCULAR | Status: DC | PRN
Start: 2023-08-20 — End: 2023-08-20
  Administered 2023-08-20 (×2): 25 ug via INTRAVENOUS

## 2023-08-20 MED ORDER — DIPHENHYDRAMINE 25 MG CAPSULE
25.0000 mg | ORAL_CAPSULE | ORAL | Status: AC
Start: 2023-08-20 — End: 2023-08-20
  Administered 2023-08-20: 25 mg via ORAL

## 2023-08-20 MED ORDER — LIDOCAINE HCL 10 MG/ML (1 %) INJECTION SOLUTION
INTRAMUSCULAR | Status: AC
Start: 2023-08-20 — End: 2023-08-20
  Filled 2023-08-20: qty 50

## 2023-08-20 MED ORDER — ASPIRIN 81 MG CHEWABLE TABLET
CHEWABLE_TABLET | ORAL | Status: AC
Start: 2023-08-20 — End: 2023-08-20
  Filled 2023-08-20: qty 4

## 2023-08-20 MED ORDER — MIDAZOLAM 5 MG/ML INJECTION WRAPPER
INTRAMUSCULAR | Status: AC
Start: 2023-08-20 — End: 2023-08-20
  Filled 2023-08-20: qty 1

## 2023-08-20 MED ORDER — NITROGLYCERIN 100 MCG/ML IN NS INJECTION
INJECTION | Freq: Once | INTRAVENOUS | Status: DC | PRN
Start: 2023-08-20 — End: 2023-08-20
  Administered 2023-08-20: 200 ug via INTRA_ARTERIAL

## 2023-08-20 MED ORDER — DIAZEPAM 5 MG TABLET
5.0000 mg | ORAL_TABLET | ORAL | Status: AC
Start: 2023-08-20 — End: 2023-08-20
  Administered 2023-08-20: 5 mg via ORAL

## 2023-08-20 MED ORDER — FENTANYL (PF) 50 MCG/ML INJECTION SOLUTION
INTRAMUSCULAR | Status: AC
Start: 2023-08-20 — End: 2023-08-20
  Filled 2023-08-20: qty 2

## 2023-08-20 MED ORDER — ACETAMINOPHEN 325 MG TABLET
975.0000 mg | ORAL_TABLET | Freq: Once | ORAL | Status: DC | PRN
Start: 2023-08-20 — End: 2023-08-20

## 2023-08-20 MED ORDER — DIPHENHYDRAMINE 25 MG CAPSULE
ORAL_CAPSULE | ORAL | Status: AC
Start: 2023-08-20 — End: 2023-08-20
  Filled 2023-08-20: qty 1

## 2023-08-20 MED ORDER — VERAPAMIL 2.5 MG/ML INTRAVENOUS SOLUTION
INTRAVENOUS | Status: AC
Start: 2023-08-20 — End: 2023-08-20
  Filled 2023-08-20: qty 2

## 2023-08-20 MED ORDER — SODIUM CHLORIDE 0.9 % INTRAVENOUS SOLUTION
INTRAVENOUS | Status: AC | PRN
Start: 2023-08-20 — End: 2023-08-20
  Administered 2023-08-20: 100 mL/h via INTRAVENOUS

## 2023-08-20 MED ORDER — ADENOSINE (DIAGNOSTIC) 3 MG/ML INTRAVENOUS SOLUTION
INTRAVENOUS | Status: AC
Start: 2023-08-20 — End: 2023-08-20
  Filled 2023-08-20: qty 60

## 2023-08-20 MED ORDER — MAGNESIUM SULFATE 1 GRAM/100 ML IN DEXTROSE 5 % INTRAVENOUS PIGGYBACK
INJECTION | INTRAVENOUS | Status: AC
Start: 2023-08-20 — End: 2023-08-20
  Filled 2023-08-20: qty 200

## 2023-08-20 MED ORDER — VERAPAMIL 2.5 MG/ML INTRAVENOUS SOLUTION
Freq: Once | INTRAVENOUS | Status: DC | PRN
Start: 2023-08-20 — End: 2023-08-20
  Administered 2023-08-20: 5 mg via INTRA_ARTERIAL

## 2023-08-20 MED ORDER — HEPARIN (PORCINE) (PF) 2,000 UNIT/1,000 ML IN 0.9 % SODIUM CHLORIDE IV
INTRAVENOUS | Status: AC
Start: 2023-08-20 — End: 2023-08-20
  Filled 2023-08-20: qty 2000

## 2023-08-20 MED ORDER — ADENOSINE (FFR) 180 MG IN 90 ML NS (TOT VOL) - 2 MG/ML INJECTION
INTRAVENOUS | Status: DC | PRN
Start: 2023-08-20 — End: 2023-08-20
  Administered 2023-08-20: 0 via INTRAVENOUS
  Administered 2023-08-20: 140 ug/kg/min via INTRAVENOUS

## 2023-08-20 MED ORDER — IOHEXOL 350 MG IODINE/ML INTRAVENOUS SOLUTION
Freq: Once | INTRAVENOUS | Status: DC | PRN
Start: 2023-08-20 — End: 2023-08-20
  Administered 2023-08-20: 85 mL via INTRA_ARTERIAL

## 2023-08-20 MED ORDER — DIAZEPAM 5 MG TABLET
ORAL_TABLET | ORAL | Status: AC
Start: 2023-08-20 — End: 2023-08-20
  Filled 2023-08-20: qty 1

## 2023-08-20 MED ORDER — ASPIRIN 81 MG CHEWABLE TABLET
324.0000 mg | CHEWABLE_TABLET | Freq: Once | ORAL | Status: AC
Start: 2023-08-20 — End: 2023-08-20
  Administered 2023-08-20: 324 mg via ORAL

## 2023-08-20 SURGICAL SUPPLY — 19 items
CATH ANGIO 5FR PGTL ST CURVE 110CM PERFORMA MIV 6 SH RADOPQ BRD RADIAL PEBAX PLYCRB NYL STRL LF (CATHETERS) ×2 IMPLANT
CATH ANGIO 5FR RADIAL TIG 4 CURVE 100CM OPTITORQUE LRG LUM SH 2 BRD SFT TIP COR SS NYL POLYUR (CATHETERS) ×2 IMPLANT
CATH GD 5FR 100CM EBU3.5 CURVE LNCHR LRG LUM RADOPQ COR NYL STRL LF (CATHETERS) ×2 IMPLANT
COVER PROBE 58X5.5IN TELESCOPICAL FOLD ELAS BAND GEL PKT STRL LF  CIV-FLX (MED SURG SUPPLIES) ×2 IMPLANT
DCNTR FLUID DISPENSR BAG BAJ DISP STRL LF  ASPT TRANSF (IV TUBING & ACCESSORIES) ×2 IMPLANT
DEVICE COMPRESS TR BAND 24CM 2 BAL TRNSPR ADJ STRAP REG RADIAL ART (MED SURG SUPPLIES) ×2 IMPLANT
GEL ELECTRODE PRPDZ RADIOLUCENT E M R SER DEFIBR (MED SURG SUPPLIES) ×2 IMPLANT
GLOVE SURG 7.5 LF  PF BEAD CUF STRL CRM 11.8IN PROTEXIS PI PLISPRN THK9.1 MIL (GLOVES AND ACCESSORIES) ×2 IMPLANT
GLOVE SURG 8 LF  PF BEAD CUF STRL CRM 11.8IN PROTEXIS PI PLISPRN THK9.1 MIL (GLOVES AND ACCESSORIES) ×4 IMPLANT
GW EMRLD .035IN 7CM 260CM FLXB END FIX COR EXCH PTFE VAS PERC 3MM RAD J CURVE DISP (WIRE) ×2 IMPLANT
GW GLDWR .035IN 3CM 180CM FLXB ATRAUMA TIP RADOPQ KINK RST NITINOL TUNG POLYUR HDRPH VAS 1.5MM J (WIRE) ×2 IMPLANT
GW RUNTHROUGH .36MM 3CM 180CM RADOPQ X FLPY VAS STRL LF  DISP PTCA (WIRE) ×2 IMPLANT
KIT INTROD 10CM 6FR 22GA GLIDESHEATH SLNDR .021IN PLASTIC SHEATH DIL 2 WL PNCT SHORT ANG MINIWIRE 45 (INTRODUCER) ×2 IMPLANT
MCTH PRESS 150CM NAVVUS II STRL LF  DISP (VASCULAR) ×2 IMPLANT
SCATPAD RAD SHIELD 145 X 165 (MED SURG SUPPLIES) ×2 IMPLANT
SET ADMN MERIT MEDICAL SYS INJ MED CNRST S DISP (VASCULAR) ×2 IMPLANT
SPONGE GAUZE 4X4IN MDCHC COTTON 12 PLY TY 7 LF  STRL DISP (WOUND CARE SUPPLY) ×4 IMPLANT
TUBING PROCEDURE 72IN RADGR STRL DISP (MED SURG SUPPLIES) ×2 IMPLANT
VALVE HMSTS WATCHDOG KIT (VASCULAR) ×2 IMPLANT

## 2023-08-20 NOTE — H&P (Signed)
[facility]      H&P UPDATE FORM                                                                                  Sue, Rose, 47 y.o. female  Date of Admission:  08/20/2023  Date of Birth:  [birth year]    08/20/2023    STOP: IF H&P IS GREATER THAN 30 DAYS FROM SURGICAL DAY COMPLETE NEW H&P IS REQUIRED.     H & P updated the day of the procedure.  1.  H&P completed within 30 days of surgical procedure and has been reviewed within 24 hours of admission but prior to surgery or a procedure requiring anesthesia services, the patient has been examined, and no change has occured in the patients condition since the H&P was completed.       Change in medications: No        No LMP recorded.        Comments: see full H&P scanned in media    2.  Patient continues to be appropriate candidate for planned surgical procedure. YES    3. HPI: Dr. Renda Rolls patient sent for mid sternal chest pain, dyspnea. 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 EF 60%. Exercise stress test performed 07-31-23 No significant ST changes on EKG, achieved 83% of maximum age predicted heart rate. Reports as fair exercise level however test stopped due to chest tightness. Dukes score -1. Patient has DM II, A1C 6.8 on 08/05/23, HTN, obesity, HLD. Patient recently started on metoprolol succinate 12.5 mg daily.     Sue Caprio, NP-C

## 2023-08-20 NOTE — Progress Notes (Signed)
08/20/23  Procedure(s):  CORONARY ANGIOGRAPHY W/LEFT HEART CATH W/WO LVG  CARD US GUIDED ACCESS  CORONARY FLOW MEASUREMENT - INITIAL VESSEL    Diagnosis:     Sedation Informed Consent, pre-sedation risk assessment and evaluation completed.  History of previous adverse experiences with sedation/analgesia/anesthesia assessed.  Monitored conscious sedation was administered under my direct supervision by an appropriately trained sedation nurse.  Appropriate Facility and Equipment compliant.      Procedure time out  Timeouts       Jeral Fruit, RN at [facility] Aug 20, 2023 1431 EDT       Timeout Details       Timeout type: Preprocedure              Procedures       Panel 1: CORONARY ANGIOGRAPHY W/LEFT HEART CATH W/WO LVG with Karelyn Brisby, Jeannett Senior, MD    Panel 1: CARD US GUIDED ACCESS with Connelly [place], Jeannett Senior, MD              Timeout Questions    Correct patient? Yes  Correct site? Yes  Correct side? N/A  Correct position? Yes  Correct procedure? Yes  Site marked? N/A  H&P note completed? Yes  Consents verified? Yes  Radiology studies available? Yes  Relevant lab results available? Yes  Allergies reviewed? Yes  Are all required blood products & devices for the procedure available? Yes  Is documentation verified? Yes             Staff Present       Physicians  Diallo Ponder, Jeannett Senior, MD Staff  Binnie Rail, RN  Julio Alm, RT (Elvera Lennox)  Jeral Fruit, RN  Lenise Arena, RN              Signing History       Staff Performed Signed    Jeral Fruit, RN Wed Aug 20, 2023 1431 EDT Wed Aug 20, 2023 1431 EDT                            Physician in  and out times  Physicians       Name Panel Role Time Period    Zaeda Mcferran, Jeannett Senior, MD Panel 1 Primary 08/20/2023 1427 - 08/20/2023 1514          Sedation Staff       Name Type Time Period    Binnie Rail, RN Invasive Nurse 08/20/2023 1417 - 08/20/2023 1514            Sedation and Procedure Times:  Event Time In   CV Sedation Start 1427   CV Sedation Stop 1502          Proc Name Event Type Event Time     CORONARY ANGIOGRAPHY W/LEFT HEART CATH W/WO LVG  Incision Start Wed Aug 20, 2023  2:33 PM    CORONARY ANGIOGRAPHY W/LEFT HEART CATH W/WO LVG  Incision Close Wed Aug 20, 2023  3:02 PM    CARD US GUIDED ACCESS  Incision Start Wed Aug 20, 2023  2:33 PM    CARD US GUIDED ACCESS  Incision Close Wed Aug 20, 2023  3:02 PM    CORONARY FLOW MEASUREMENT - INITIAL VESSEL  Incision Start Wed Aug 20, 2023  2:48 PM    CORONARY FLOW MEASUREMENT - INITIAL VESSEL  Incision Close Wed Aug 20, 2023  3:02 PM            Aldrete Scores    Pre Sedation  Activity: 2-->able to move 4 extremities voluntarily or on command  Respiration: 2-->able to breathe and cough freely  Circulation: 2-->BP within 20% of pre-anesthetic level  Consciousness: 2-->fully awake  O2 Saturation: 2-->able to maintain O2 saturation greater than 92% on room air  Dressing: 2-->dry and clean or not applicable  Pain: 2-->pain free  Ambulation: 2-->able to stand up and walk straight, on ordered bedrest, or performing at previous level of functioning  Fasting/Feeding: 2-->able to drink fluids or NPO, minimal nausea/ no vomiting  Urine Output: 2-->has voided, adequate urine output per device, or not applicable  Modified Aldrete Score: 20      Post Sedation  Assessment Scored:: Post-Procedure  Activity: 2-->able to move 4 extremities voluntarily or on command  Respiration: 2-->able to breathe and cough freely  Circulation: 2-->BP within 20% of pre-anesthetic level  Consciousness: 2-->fully awake  O2 Saturation: 2-->able to maintain O2 saturation greater than 92% on room air  Dressing: 2-->dry and clean or not applicable  Pain: 2-->pain free  Ambulation: 2-->able to stand up and walk straight, on ordered bedrest, or performing at previous level of functioning  Urine Output: 2-->has voided, adequate urine output per device, or not applicable  Post Modified Aldrete Score: 20      Sedation Type: (P) Moderate Sedation     Medications (moderate): (P) Fentanyl, Versed     Unit:  (P) [facility] Invasive Lab  IV Type: (P) Peripheral IV        Effects of Administration: (P) Successful sedation w/o adverse events       Patient was continuously monitored throughout the procedure.  Provider was in attendance throughout sedation.  See Invasive Procedure Log for additional details.    Elam Dutch, MD       Blood loss: minimal  Samples: N/A  Complications: none

## 2023-08-20 NOTE — Nurses Notes (Signed)
Able to stand and ambulate to restroom without difficulty.  Gait steady, bears all weight.  Dresses self independently .  AVS given, discharge instructions discussed, all questions answered, verbalized understanding.  Transported to private vehicle via wheelchair by [employer].  No swelling, hematoma, or bleeding noted to radial insertion site.  No c/o voiced, no distress noted.  Family to drive patient home.

## 2023-08-22 ENCOUNTER — Telehealth (HOSPITAL_COMMUNITY): Payer: Self-pay | Admitting: INTERVENTIONAL CARDIOLOGY

## 2023-08-24 ENCOUNTER — Encounter (INDEPENDENT_AMBULATORY_CARE_PROVIDER_SITE_OTHER): Payer: Self-pay | Admitting: Internal Medicine

## 2023-09-22 ENCOUNTER — Other Ambulatory Visit (INDEPENDENT_AMBULATORY_CARE_PROVIDER_SITE_OTHER): Payer: Self-pay

## 2023-09-22 LAB — INVASIVE CARDIOLOGY PROCEDURE: Cath EF Quantitative: 60 %

## 2023-10-24 ENCOUNTER — Other Ambulatory Visit (INDEPENDENT_AMBULATORY_CARE_PROVIDER_SITE_OTHER): Payer: Self-pay | Admitting: Internal Medicine

## 2023-11-07 ENCOUNTER — Encounter (INDEPENDENT_AMBULATORY_CARE_PROVIDER_SITE_OTHER): Payer: Self-pay | Admitting: Surgery

## 2023-11-11 ENCOUNTER — Ambulatory Visit (INDEPENDENT_AMBULATORY_CARE_PROVIDER_SITE_OTHER): Payer: Self-pay | Admitting: Surgery

## 2023-11-18 ENCOUNTER — Ambulatory Visit (INDEPENDENT_AMBULATORY_CARE_PROVIDER_SITE_OTHER): Payer: Self-pay | Admitting: Surgery

## 2023-11-24 ENCOUNTER — Encounter (INDEPENDENT_AMBULATORY_CARE_PROVIDER_SITE_OTHER): Payer: Self-pay | Admitting: Internal Medicine

## 2023-12-09 ENCOUNTER — Ambulatory Visit: Payer: 59 | Attending: Internal Medicine | Admitting: Internal Medicine

## 2023-12-09 ENCOUNTER — Encounter (INDEPENDENT_AMBULATORY_CARE_PROVIDER_SITE_OTHER): Payer: Self-pay | Admitting: Internal Medicine

## 2023-12-09 ENCOUNTER — Other Ambulatory Visit: Payer: Self-pay

## 2023-12-09 DIAGNOSIS — E039 Hypothyroidism, unspecified: Secondary | ICD-10-CM

## 2023-12-09 DIAGNOSIS — G2581 Restless legs syndrome: Secondary | ICD-10-CM

## 2023-12-09 DIAGNOSIS — K219 Gastro-esophageal reflux disease without esophagitis: Secondary | ICD-10-CM

## 2023-12-09 DIAGNOSIS — Z7984 Long term (current) use of oral hypoglycemic drugs: Secondary | ICD-10-CM

## 2023-12-09 DIAGNOSIS — K439 Ventral hernia without obstruction or gangrene: Secondary | ICD-10-CM

## 2023-12-09 DIAGNOSIS — I10 Essential (primary) hypertension: Secondary | ICD-10-CM

## 2023-12-09 DIAGNOSIS — E538 Deficiency of other specified B group vitamins: Secondary | ICD-10-CM

## 2023-12-09 DIAGNOSIS — E119 Type 2 diabetes mellitus without complications: Secondary | ICD-10-CM

## 2023-12-09 DIAGNOSIS — L282 Other prurigo: Secondary | ICD-10-CM

## 2023-12-09 MED ORDER — CLOBETASOL 0.05 % TOPICAL CREAM
TOPICAL_CREAM | Freq: Two times a day (BID) | CUTANEOUS | 2 refills | Status: AC
Start: 2023-12-09 — End: 2023-12-16

## 2023-12-09 MED ORDER — CYANOCOBALAMIN (VIT B-12) 1,000 MCG/ML INJECTION SOLUTION
1000.0000 ug | Freq: Every day | INTRAMUSCULAR | 0 refills | Status: AC
Start: 2023-12-09 — End: 2023-12-16

## 2023-12-09 MED ORDER — GABAPENTIN 600 MG TABLET
600.0000 mg | ORAL_TABLET | Freq: Three times a day (TID) | ORAL | 3 refills | Status: DC
Start: 2023-12-09 — End: 2024-04-27

## 2023-12-09 MED ORDER — ESCITALOPRAM 20 MG TABLET: 20.0000 mg | ORAL_TABLET | Freq: Every day | ORAL | 2 refills | Status: DC

## 2023-12-09 MED ORDER — CYANOCOBALAMIN (VIT B-12) 1,000 MCG/ML INJECTION SOLUTION
1000.0000 ug | Freq: Every day | INTRAMUSCULAR | 3 refills | Status: AC
Start: 2023-12-09 — End: 2023-12-16

## 2023-12-09 NOTE — Assessment & Plan Note (Signed)
Monitor  BP   same meds

## 2023-12-09 NOTE — Assessment & Plan Note (Signed)
Follows with dr  Allena Katz   Continue  Pepcid

## 2023-12-09 NOTE — Assessment & Plan Note (Signed)
Fu TSH  labs ordered   Continue synthroid   49

## 2023-12-09 NOTE — Assessment & Plan Note (Signed)
Metformin 500 mg bid   pt has not tolerated many medications

## 2023-12-09 NOTE — Progress Notes (Signed)
INTERNAL MEDICINE, BUILDING A  [address]  [place] [address]  Operated by [facility]  History and Physical    Name: Sue Rose MRN:  [id number]   Date: 12/09/2023 DOB:  Apr 08, 1976 (47 y.o.)         Name: Sue Rose                       Date of Birth: Nov 19, 1975   MRN:  [id number]                         Date of visit: 12/09/2023     PCP: Samuella Cota, PA-C     Subjective  Sue Rose is a 48 y.o. year old female who presents for Follow Up 4 Months (Place in chest its back      wants to increase Lexapro   for depression   back and leg and knee pain.)   to clinic.  No specialty comments available.   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 01/21/2022    Symptomatic varicose veins 01/21/2022    Thyroid nodule 01/21/2022    Type 2 diabetes mellitus without complication (CMS HCC) 01/21/2022    Vitamin D deficiency 01/21/2022      Current Outpatient Medications   Medication Sig    aspirin 81 mg Oral Tablet, Chewable Chew 1 Tablet (81 mg total) Once a day    cetirizine (ZYRTEC) 10 mg Oral Tablet Take 1 Tablet (10 mg total) by mouth Once per day as needed for Allergies    clobetasoL (TEMOVATE) 0.05 % Cream Apply topically Twice daily for 7 days    cyanocobalamin (VITAMIN B12) 1,000 mcg/mL Injection Solution Inject 1 mL (1,000 mcg total) under the skin Once a day for 7 days    cyanocobalamin (VITAMIN B12) 1,000 mcg/mL Injection Solution Inject 1 mL (1,000 mcg total) under the skin Once a day for 7 days Indications: inadequate  vitamin B12    cyclobenzaprine (FLEXERIL) 10 mg Oral Tablet TAKE ONE TABLET (10 MG TOTAL) BY MOUTH THREE TIMES A DAY    ergocalciferol, vitamin D2, (DRISDOL) 1,250 mcg (50,000 unit) Oral Capsule Take 1 Capsule (50,000 Units total) by mouth Every 7 days    escitalopram oxalate (LEXAPRO) 20 mg Oral Tablet Take 1 Tablet (20 mg total) by mouth Once a day    famotidine (PEPCID) 40 mg Oral Tablet Take 1 Tablet (40 mg total) by mouth Twice daily    gabapentin (NEURONTIN) 600 mg Oral Tablet Take 1 Tablet (600 mg total) by mouth Three times a day    hydroCHLOROthiazide (HYDRODIURIL) 25 mg Oral Tablet Take 1 Tablet (25 mg total) by mouth Every morning    levothyroxine (SYNTHROID) 50 mcg Oral Tablet Take 1 Tablet (50 mcg total) by mouth Once a day    metFORMIN (GLUCOPHAGE) 500 mg Oral Tablet Take 1 Tablet (500 mg total) by mouth Twice daily with food Patient only takes once a day  metoprolol succinate (TOPROL-XL) 25 mg Oral Tablet Sustained Release 24 hr Take 0.5 Tablets (12.5 mg total) by mouth Once a day    nitroGLYCERIN (NITROSTAT) 0.4 mg Sublingual Tablet, Sublingual Place 1 Tablet (0.4 mg total) under the tongue Every 5 minutes as needed for Chest pain for 3 doses over 15 minutes    olmesartan (BENICAR) 40 mg Oral Tablet Take 1 Tablet (40 mg total) by mouth Once a day    omeprazole (PRILOSEC) 40 mg Oral Capsule, Delayed Release(E.C.) Take 1 Capsule (40 mg total) by mouth Twice daily    rosuvastatin (CRESTOR) 20 mg Oral Tablet Take 1 Tablet (20 mg total) by mouth Every evening    sucralfate (CARAFATE) 1 gram Oral Tablet Take 1 Tablet (1 g total) by mouth Every 6 hours            Fu Visit Telemed     This  47 you is for  follow  up     Co Place in chest near neck  had  triamcinolone cream  in past    It goes and comes       Co feeling  restless on  legs  and  leg pain    discussed  meds     Co back  pain  has seen Specialist but she does not want injections      Neurontin  discussed     Fu knee pain  discussed  seen by   Ortho needs fu    Fu low vit  B12  pt has  not been taking her B12  will restart     Fu abn mammogram  to see Dr Dewaine Conger   Thursday     Co increase in stress and anxiety  pt is depressed and feels she needs  to increase  Lexapro            REVIEW OF SYSTEMS:   Review of Systems  Review of Systems have been reviewed  ROS  Const  Reports system reviewed and no additional complaints, except as documented  ENT  Reports system reviewed and no additional complaints, except as documented  Resp  Reports system reviewed and no additional complaints, except as documented  GI  Reports system reviewed and no additional complaints, except as documented     Objective: There were no vitals taken for this visit.               PHYSICAL EXAM  Physical Exam  Gen: NAD. Alert.   Exam  Const  General: cooperative, no acute distress and alert  Resp  Effort & Inspection: able to speak in complete sentences  Psych  Mental Status: mental status grossly normal  Mood: congruent mood  Affect: normal affect  Attitude: cooperative  Insight: insight good  Judgment: judgment good        Assessment & Plan  Ventral hernia  Referral Dr  Baldomero Lamy    Acquired hypothyroidism  Fu TSH  labs ordered   Continue synthroid   50   Essential hypertension  Monitor  BP   same meds     Gastroesophageal reflux disease without esophagitis  Follows with dr  Allena Katz   Continue  Pepcid    Type 2 diabetes mellitus without complication, without long-term current use of insulin (CMS HCC)  Metformin 500 mg bid   pt has not tolerated many medications    Vitamin B 12 deficiency  Needs to be compliant with B12    RLS (restless legs  syndrome)  Will see how she does with Neurontin    Pruritic rash  Trial of  clobetasol  if no better  referral to derm     Increase in anxiety  will increase Lexapro   20 mg    Dr Baldomero Lamy appt  coming  up  for hemorrhoids    Pt  still bothering  her    Pt says her Ventral hernia  seems to bother her  more  she is about to see the surgeon will send a  referral to go with the above    Co shooting back pain and  legs  bothering her worse    legs feel  restless     Pt has seen  Dr  Pearlie Oyster for inj  but never  had  them    Pt says  physical therapy did not  help    Pt is on  gabapentin   but   300 mg  tid   due to break through   will increase to  600 tid      Time on phone 25 min    I personally offered the service to the patient, and obtained verbal consent to provide this service.    Samuella Cota, PA-C

## 2023-12-11 ENCOUNTER — Ambulatory Visit: Payer: 59 | Attending: Internal Medicine

## 2023-12-11 ENCOUNTER — Other Ambulatory Visit: Payer: Self-pay

## 2023-12-11 ENCOUNTER — Other Ambulatory Visit (HOSPITAL_COMMUNITY): Payer: 59 | Admitting: Internal Medicine

## 2023-12-11 ENCOUNTER — Other Ambulatory Visit (INDEPENDENT_AMBULATORY_CARE_PROVIDER_SITE_OTHER): Payer: Self-pay

## 2023-12-11 DIAGNOSIS — E119 Type 2 diabetes mellitus without complications: Secondary | ICD-10-CM | POA: Insufficient documentation

## 2023-12-11 DIAGNOSIS — I10 Essential (primary) hypertension: Secondary | ICD-10-CM | POA: Insufficient documentation

## 2023-12-11 DIAGNOSIS — E559 Vitamin D deficiency, unspecified: Secondary | ICD-10-CM | POA: Insufficient documentation

## 2023-12-11 DIAGNOSIS — K439 Ventral hernia without obstruction or gangrene: Secondary | ICD-10-CM | POA: Insufficient documentation

## 2023-12-11 DIAGNOSIS — K219 Gastro-esophageal reflux disease without esophagitis: Secondary | ICD-10-CM

## 2023-12-11 DIAGNOSIS — G2581 Restless legs syndrome: Secondary | ICD-10-CM | POA: Insufficient documentation

## 2023-12-11 DIAGNOSIS — E538 Deficiency of other specified B group vitamins: Secondary | ICD-10-CM | POA: Insufficient documentation

## 2023-12-11 DIAGNOSIS — E782 Mixed hyperlipidemia: Secondary | ICD-10-CM | POA: Insufficient documentation

## 2023-12-11 DIAGNOSIS — E039 Hypothyroidism, unspecified: Secondary | ICD-10-CM

## 2023-12-12 ENCOUNTER — Ambulatory Visit (INDEPENDENT_AMBULATORY_CARE_PROVIDER_SITE_OTHER): Payer: Self-pay | Admitting: Internal Medicine

## 2023-12-12 LAB — COMPREHENSIVE METABOLIC PNL, FASTING
ALBUMIN/GLOBULIN RATIO: 1.2 (ref 0.8–1.4)
ALBUMIN: 4.1 g/dL (ref 3.5–5.7)
ALKALINE PHOSPHATASE: 75 U/L (ref 34–104)
ALT (SGPT): 28 U/L (ref 7–52)
ANION GAP: 7 mmol/L (ref 4–13)
AST (SGOT): 26 U/L (ref 13–39)
BILIRUBIN TOTAL: 0.3 mg/dL (ref 0.3–1.0)
BUN/CREA RATIO: 14 (ref 6–22)
BUN: 11 mg/dL (ref 7–25)
CALCIUM, CORRECTED: 9 mg/dL (ref 8.9–10.8)
CALCIUM: 9.1 mg/dL (ref 8.6–10.3)
CHLORIDE: 98 mmol/L (ref 98–107)
CO2 TOTAL: 26 mmol/L (ref 21–31)
CREATININE: 0.77 mg/dL (ref 0.60–1.30)
ESTIMATED GFR: 95 mL/min/{1.73_m2} (ref >59)
GLOBULIN: 3.4 (ref 2.0–3.5)
GLUCOSE: 134 mg/dL — ABNORMAL HIGH (ref 74–109)
OSMOLALITY, CALCULATED: 264 mosm/kg — ABNORMAL LOW (ref 270–290)
POTASSIUM: 4 mmol/L (ref 3.5–5.1)
PROTEIN TOTAL: 7.5 g/dL (ref 6.4–8.9)
SODIUM: 131 mmol/L — ABNORMAL LOW (ref 136–145)

## 2023-12-12 LAB — CBC WITH DIFF
BASOPHIL #: 0.1 10*3/uL (ref 0.00–0.10)
BASOPHIL %: 1 % (ref 0–1)
EOSINOPHIL #: 0.6 10*3/uL — ABNORMAL HIGH (ref 0.00–0.50)
EOSINOPHIL %: 7 % (ref 1–7)
HCT: 38.3 % (ref 31.2–41.9)
HGB: 12.5 g/dL (ref 10.9–14.3)
LYMPHOCYTE #: 2.7 10*3/uL (ref 1.10–3.10)
LYMPHOCYTE %: 31 % (ref 16–46)
MCH: 27.5 pg (ref 24.7–32.8)
MCHC: 32.7 g/dL (ref 32.3–35.6)
MCV: 84 fL (ref 75.5–95.3)
MONOCYTE #: 0.6 10*3/uL (ref 0.20–0.90)
MONOCYTE %: 6 % (ref 4–11)
MPV: 8.8 fL (ref 7.9–10.8)
NEUTROPHIL #: 4.8 10*3/uL (ref 1.90–8.20)
NEUTROPHIL %: 55 % (ref 43–77)
PLATELETS: 360 10*3/uL (ref 140–440)
RBC: 4.56 10*6/uL (ref 3.63–4.92)
RDW: 14.1 % (ref 12.3–17.7)
WBC: 8.8 10*3/uL (ref 3.8–11.8)

## 2023-12-12 LAB — LIPID PANEL
CHOL/HDL RATIO: 2.5
CHOLESTEROL: 117 mg/dL (ref <200)
HDL CHOL: 47 mg/dL (ref >=40)
LDL CALC: 46 mg/dL (ref 0–100)
TRIGLYCERIDES: 119 mg/dL (ref <=150)
VLDL CALC: 24 mg/dL (ref 0–50)

## 2023-12-12 LAB — VITAMIN B12: VITAMIN B 12: 316 pg/mL (ref 180–914)

## 2023-12-12 LAB — THYROID STIMULATING HORMONE (SENSITIVE TSH): TSH: 4.628 u[IU]/mL (ref 0.450–5.330)

## 2023-12-12 LAB — HGA1C (HEMOGLOBIN A1C WITH EST AVG GLUCOSE): HEMOGLOBIN A1C: 7.4 % — ABNORMAL HIGH (ref 4.0–6.0)

## 2023-12-16 ENCOUNTER — Other Ambulatory Visit (INDEPENDENT_AMBULATORY_CARE_PROVIDER_SITE_OTHER): Payer: Self-pay | Admitting: Internal Medicine

## 2023-12-16 MED ORDER — METFORMIN 500 MG TABLET
1000.0000 mg | ORAL_TABLET | Freq: Two times a day (BID) | ORAL | 3 refills | Status: AC
Start: 2023-12-16 — End: ?

## 2023-12-24 ENCOUNTER — Other Ambulatory Visit (INDEPENDENT_AMBULATORY_CARE_PROVIDER_SITE_OTHER): Payer: Self-pay | Admitting: Internal Medicine

## 2023-12-30 ENCOUNTER — Encounter (INDEPENDENT_AMBULATORY_CARE_PROVIDER_SITE_OTHER): Payer: Self-pay | Admitting: Surgery

## 2023-12-30 ENCOUNTER — Other Ambulatory Visit: Payer: Self-pay

## 2023-12-30 ENCOUNTER — Ambulatory Visit (INDEPENDENT_AMBULATORY_CARE_PROVIDER_SITE_OTHER): Payer: Self-pay | Admitting: Surgery

## 2023-12-30 VITALS — BP 147/90 | HR 83 | Temp 97.6°F | Ht 65.0 in | Wt 247.0 lb

## 2023-12-30 DIAGNOSIS — K648 Other hemorrhoids: Secondary | ICD-10-CM

## 2023-12-30 DIAGNOSIS — K429 Umbilical hernia without obstruction or gangrene: Secondary | ICD-10-CM

## 2023-12-30 DIAGNOSIS — K6289 Other specified diseases of anus and rectum: Secondary | ICD-10-CM

## 2023-12-30 DIAGNOSIS — G8929 Other chronic pain: Secondary | ICD-10-CM

## 2023-12-30 DIAGNOSIS — K439 Ventral hernia without obstruction or gangrene: Secondary | ICD-10-CM

## 2023-12-30 NOTE — Progress Notes (Signed)
 GENERAL SURGERY, [facility]  [address]  [place] [address]    History and Physical    Name: Sue Rose MRN:  [id number]   Date: 12/30/2023 DOB:  15-Jun-1976 (48 y.o.)              Reason for Visit: Hemorrhoids    History of Present Illness  Ms. Whalin presents today for evaluation and management of chronic rectal pain and the possible need for hemorrhoidectomy.  The patient underwent colonoscopy in October 16, 2022 which showed moderate internal hemorrhoids, grade 1 and a small benign polyp at 25 cm.  The patient has tried all types of hemorrhoidal creams, suppositories and therapies to no avail.  Her main complaint is discomfort along the right lateral anal area.  The patient denies any blood or purulent drainage.    In addition, the patient has a fairly large periumbilical hernia.    Negative blood thinner  Positive diabetes      Review of the result(s) of each unique test:  Patient underwent diagnostic testing ( as above ) prior to this dates visit.  I have personally reviewed the results and that serves as a component of the medical decision making for this encounter       Review of prior external note(s) from each unique source:  Patients referral to this office including a recent assessment by the referring provider.  This was reviewed by me for this unique office visit for the indication and intent of the referral as well as any pertinent medical or surgical history relevant to the patients independent evaluation by me today.      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     Symptomatic varicose veins      Thyroid nodule     Type 2 diabetes mellitus without complication (CMS HCC)     Vitamin D deficiency          Past Surgical History:   Procedure Laterality Date    CARDIAC CATHETERIZATION      06/05/22 dr Renda Rolls    COLONOSCOPY      09/2023    HX CESAREAN SECTION      HX CHOLECYSTECTOMY      HX ENDOMETRIAL ABLATION      late 30's    HX UPPER ENDOSCOPY      09/2023         Current Outpatient Medications   Medication Sig    aspirin 81 mg Oral Tablet, Chewable Chew 1 Tablet (81 mg total) Once a day    cetirizine (ZYRTEC) 10 mg Oral Tablet Take 1 Tablet (10 mg total) by mouth Once per day as needed for Allergies    cyclobenzaprine (FLEXERIL) 10 mg Oral Tablet TAKE ONE TABLET (10 MG TOTAL) BY MOUTH THREE TIMES A DAY    ergocalciferol, vitamin D2, (DRISDOL) 1,250 mcg (50,000 unit) Oral Capsule Take 1 Capsule (50,000 Units total) by mouth Every 7 days    escitalopram oxalate (LEXAPRO) 20 mg Oral Tablet Take  1 Tablet (20 mg total) by mouth Once a day    famotidine (PEPCID) 40 mg Oral Tablet Take 1 Tablet (40 mg total) by mouth Twice daily    gabapentin (NEURONTIN) 600 mg Oral Tablet Take 1 Tablet (600 mg total) by mouth Three times a day    hydroCHLOROthiazide (HYDRODIURIL) 25 mg Oral Tablet Take 1 Tablet (25 mg total) by mouth Every morning    levothyroxine (SYNTHROID) 50 mcg Oral Tablet Take 1 Tablet (50 mcg total) by mouth Once a day    metFORMIN (GLUCOPHAGE) 500 mg Oral Tablet Take 2 Tablets (1,000 mg total) by mouth Twice daily with food Patient only takes once a day    metoprolol succinate (TOPROL-XL) 25 mg Oral Tablet Sustained Release 24 hr Take 0.5 Tablets (12.5 mg total) by mouth Once a day    nitroGLYCERIN (NITROSTAT) 0.4 mg Sublingual Tablet, Sublingual Place 1 Tablet (0.4 mg total) under the tongue Every 5 minutes as needed for Chest pain for 3 doses over 15 minutes    olmesartan (BENICAR) 40 mg Oral Tablet Take 1 Tablet (40 mg total) by mouth Once a day    omeprazole (PRILOSEC) 40 mg Oral Capsule, Delayed  Release(E.C.) Take 1 Capsule (40 mg total) by mouth Twice daily    rosuvastatin (CRESTOR) 20 mg Oral Tablet Take 1 Tablet (20 mg total) by mouth Every evening    sucralfate (CARAFATE) 1 gram Oral Tablet Take 1 Tablet (1 g total) by mouth Every 6 hours     Allergies   Allergen Reactions    Ace Inhibitors  Other Adverse Reaction (Add comment)     ocugh     Family Medical History:       Problem Relation (Age of Onset)    Diabetes Mother, Father, Sister    Gallbladder disease Mother, Sister    Hypertension (High Blood Pressure) Mother, Father, Sister    MI <29 years of age Mother            Social History     Tobacco Use    Smoking status: Never    Smokeless tobacco: Never   Substance Use Topics    Alcohol use: Never    Drug use: Never            Physical Examination:  Vitals:    12/30/23 1054   BP: (!) 147/90   Pulse: 83   Temp: 36.4 C (97.6 F)   SpO2: 96%   Weight: 112 kg (247 lb)   Height: 1.651 m (5\' 5" )   BMI: 41.1        General: appropriate for age. in no acute distress.    Vital signs are present above and have been reviewed by me     HEENT: Atraumatic, Normocephalic. PERRLA. EOMI. Nose clear. Throat clear    Lungs: Nonlabored breathing with symmetric expansion. Clear to auscultation bilaterally    Heart:Regular wth respect to rate and rythmn.    Abdomen:Soft.  Moderate sized periumbilical hernia    Rectal examination shows no evidence of any prolapsing hemorrhoids nor is there evidence any fissure.  No evidence of perirectal abscess.    Nondistended and otherwise benign    Extremities: Grossly normal. No major deformities     Neuro:  Grossly normal motor and sensory function    Psychiatric: Alert and oriented to person, place, and time. affect appropriate      Assessment and Plan  Flexible sigmoidoscopy scheduled for 01/07/2024 at 8:30 a.m.     I discussed  with the patient the fact that she does not need a full colonoscopy since she had 1 a little over a year ago.  I do however, would like to look at the  rectal region with a flexible sigmoidoscopy to further evaluate the source of her perirectal pain.    After this has evaluated and managed, we will then address her umbilical hernia.    Discussed indications, risks, and benefits of flexible sigmoidoscopy with possible biopsy/polypectomy with the patient.  Discussed the possibility of polypectomy, biopsies, and possible repeat examinations.  Risks include bleeding, sedation risks, possibility of missed diagnosis of polyp or malignancy, and remote possibilities of perforation and death.  All questions were answered, and informed consent was clearly obtained.      Follow Up:  No follow-ups on file.      ICD-10-CM    1. Rectal pain, chronic  K62.89     G89.29       2. Ventral hernia  K43.9           Iesha Summerhill B Stuart Mirabile, MD ,MBA,FACS    I appreciate the opportunity to be involved in the care of your patients.  If you have any questions or concerns regarding this encounter, please do not hesitate to contact me at your convenience.      This note may have been partially generated using MModal Fluency Direct system, and there may be some incorrect words, spellings, and punctuation that were not noted in checking the note before saving, though effort was made to avoid such errors.

## 2024-01-05 ENCOUNTER — Other Ambulatory Visit: Payer: Self-pay

## 2024-01-05 ENCOUNTER — Encounter (INDEPENDENT_AMBULATORY_CARE_PROVIDER_SITE_OTHER): Payer: Self-pay | Admitting: Internal Medicine

## 2024-01-05 ENCOUNTER — Ambulatory Visit: Attending: Internal Medicine | Admitting: Internal Medicine

## 2024-01-05 ENCOUNTER — Telehealth (INDEPENDENT_AMBULATORY_CARE_PROVIDER_SITE_OTHER): Payer: Self-pay | Admitting: Surgery

## 2024-01-05 VITALS — HR 88 | Temp 98.2°F

## 2024-01-05 DIAGNOSIS — L739 Follicular disorder, unspecified: Secondary | ICD-10-CM | POA: Insufficient documentation

## 2024-01-05 DIAGNOSIS — K120 Recurrent oral aphthae: Secondary | ICD-10-CM | POA: Insufficient documentation

## 2024-01-05 DIAGNOSIS — K1120 Sialoadenitis, unspecified: Secondary | ICD-10-CM | POA: Insufficient documentation

## 2024-01-05 MED ORDER — PREDNISONE 20 MG TABLET
20.0000 mg | ORAL_TABLET | Freq: Two times a day (BID) | ORAL | 0 refills | Status: AC
Start: 2024-01-05 — End: 2024-01-10

## 2024-01-05 MED ORDER — TRIAMCINOLONE ACETONIDE 0.1 % DENTAL PASTE
1.0000 | PASTE | Freq: Three times a day (TID) | DENTAL | 0 refills | Status: AC
Start: 2024-01-05 — End: ?

## 2024-01-05 MED ORDER — AMOXICILLIN 875 MG-POTASSIUM CLAVULANATE 125 MG TABLET
1.0000 | ORAL_TABLET | Freq: Two times a day (BID) | ORAL | 0 refills | Status: AC
Start: 2024-01-05 — End: 2024-01-15

## 2024-01-05 NOTE — Progress Notes (Unsigned)
 INTERNAL MEDICINE, BUILDING A  [address]  [place] [address]  Operated by [facility]  Progress Note    Name: SADA MAZZONI MRN:  [id number]   Date: 01/05/2024 DOB:  [birth year] (47 y.o.)              Chief Complaint: Ear Problem(s) (For two day  having pain around the left ear  and down neck.)       HPI: DARLEN GLEDHILL is a 48 y.o. female who complains of  Wed place in head and  Thursday  lower  back of head  pain   than  she got pain in neck   Thursday ear pain anterior  and  posterior  aspect   No denal pain   clear  nasal  drainage    Took flexoril and Neurontin  as thought it was a muscle     Took a diflucan   due to   blister  on tongue  over  Weekend         Sigmoid   on   Wed          Allergies:  Allergies   Allergen Reactions    Ace Inhibitors  Other Adverse Reaction (Add comment)     ocugh       aspirin 81 mg Oral Tablet, Chewable, Chew 1 Tablet (81 mg total) Once a day  cetirizine (ZYRTEC) 10 mg Oral Tablet, Take 1 Tablet (10 mg total) by mouth Once per day as needed for Allergies  cyclobenzaprine (FLEXERIL) 10 mg Oral Tablet, TAKE ONE TABLET (10 MG TOTAL) BY MOUTH THREE TIMES A DAY  ergocalciferol, vitamin D2, (DRISDOL) 1,250 mcg (50,000 unit) Oral Capsule, Take 1 Capsule (50,000 Units total) by mouth Every 7 days  escitalopram oxalate (LEXAPRO) 20 mg Oral Tablet, Take 1 Tablet (20 mg total) by mouth Once a day  famotidine (PEPCID) 40 mg Oral Tablet, Take 1 Tablet (40 mg total) by mouth Twice daily  gabapentin (NEURONTIN) 600 mg Oral Tablet, Take 1 Tablet (600 mg total) by mouth Three times a day  hydroCHLOROthiazide (HYDRODIURIL) 25 mg Oral Tablet, Take 1 Tablet (25 mg total) by mouth Every morning  levothyroxine (SYNTHROID) 50 mcg Oral Tablet, Take 1 Tablet (50 mcg total) by mouth Once a day  metFORMIN (GLUCOPHAGE) 500 mg Oral Tablet, Take 2 Tablets (1,000 mg total) by mouth Twice daily with food Patient only takes once a day  metoprolol succinate (TOPROL-XL) 25 mg Oral  Tablet Sustained Release 24 hr, Take 0.5 Tablets (12.5 mg total) by mouth Once a day  nitroGLYCERIN (NITROSTAT) 0.4 mg Sublingual Tablet, Sublingual, Place 1 Tablet (0.4 mg total) under the tongue Every 5 minutes as needed for Chest pain for 3 doses over 15 minutes  olmesartan (BENICAR) 40 mg Oral Tablet, Take 1 Tablet (40 mg total) by mouth Once a day  omeprazole (PRILOSEC) 40 mg Oral Capsule, Delayed Release(E.C.), Take 1 Capsule (40 mg total) by mouth Twice daily  rosuvastatin (CRESTOR) 20 mg Oral Tablet, Take 1 Tablet (20 mg total) by mouth Every evening  sucralfate (CARAFATE) 1 gram Oral Tablet, Take 1 Tablet (1 g total) by mouth Every 6 hours    No facility-administered medications prior to visit.       Review of Systems -     OBJECTIVE:  Vitals:    01/05/24 1456   Pulse: 88   Temp: 36.8 C (98.2 F)   SpO2: 96%      Physical Exam  Alert and oriented  x 3    left  scalp one folicular are with erythema   Mouth throat clear  tip of tongue  apthous ulcer noted   Swelling noted on  left side of face into neck     Eac clear  tm dull   tender pre and post auricular area and  parotid area hard and swollen    Tender to occipital area fullness  and pre clavicular fullness   Pre and post cervical nodes  swollen and tender      Lungs  cta  wo rrw         ASSESSMENT:     ICD-10-CM    1. Aphthous ulcer of tongue  K12.0       2. Parotitis  K11.20       3. Folliculitis  L73.9           Orders Placed This Encounter    amoxicillin-pot clavulanate (AUGMENTIN) 875-125 mg Oral Tablet    predniSONE (DELTASONE) 20 mg Oral Tablet    triamcinolone acetonide (KENALOG) 0.1 % Dental Paste        PLAN: Treatment per orders . Call or return to clinic prn if these symptoms worsen or fail to improve as anticipated.  Meds reviewed as well as labs.  Start Augmentin  875mg  bid x 10 days     Prednisone  bid  x  5 days    Kenalog to tongue   tid  prn    Avoid acid based foods    Ct scan if no improvement over the next  week   Ok for  sigmoidoscopy this week unless change in symptoms     Samuella Cota, PA-C

## 2024-01-05 NOTE — Telephone Encounter (Signed)
 Patient called stating she has an infection and was wrote prednisone and Augmentin. Is scheduled for durgery on Wednesday. Does this need to be rescheduled due to infection? Ma Rings, LPN  1/61/0960 45:40

## 2024-01-06 NOTE — Telephone Encounter (Signed)
 What is she being treated for? If it is pneumonia or upper respiratory then we need to postpone

## 2024-02-25 ENCOUNTER — Ambulatory Visit (HOSPITAL_COMMUNITY): Admitting: Surgery

## 2024-02-25 ENCOUNTER — Encounter (HOSPITAL_COMMUNITY)
Admission: RE | Disposition: A | Discharge status: Home / Self Care | Payer: Self-pay | Source: Ambulatory Visit | Attending: Surgery

## 2024-02-25 ENCOUNTER — Ambulatory Visit
Admission: RE | Admit: 2024-02-25 | Discharge: 2024-02-25 | Disposition: A | Discharge status: Home / Self Care | Payer: Self-pay | Source: Ambulatory Visit | Attending: Surgery | Admitting: Surgery

## 2024-02-25 ENCOUNTER — Encounter (HOSPITAL_COMMUNITY): Payer: Self-pay | Admitting: Surgery

## 2024-02-25 ENCOUNTER — Ambulatory Visit (HOSPITAL_COMMUNITY)

## 2024-02-25 ENCOUNTER — Other Ambulatory Visit: Payer: Self-pay

## 2024-02-25 DIAGNOSIS — E119 Type 2 diabetes mellitus without complications: Secondary | ICD-10-CM | POA: Insufficient documentation

## 2024-02-25 DIAGNOSIS — Z6839 Body mass index (BMI) 39.0-39.9, adult: Secondary | ICD-10-CM | POA: Insufficient documentation

## 2024-02-25 DIAGNOSIS — Z8719 Personal history of other diseases of the digestive system: Secondary | ICD-10-CM | POA: Insufficient documentation

## 2024-02-25 DIAGNOSIS — E039 Hypothyroidism, unspecified: Secondary | ICD-10-CM | POA: Insufficient documentation

## 2024-02-25 DIAGNOSIS — Z860100 Personal history of colon polyps, unspecified: Secondary | ICD-10-CM | POA: Insufficient documentation

## 2024-02-25 DIAGNOSIS — G4730 Sleep apnea, unspecified: Secondary | ICD-10-CM | POA: Insufficient documentation

## 2024-02-25 DIAGNOSIS — K219 Gastro-esophageal reflux disease without esophagitis: Secondary | ICD-10-CM | POA: Insufficient documentation

## 2024-02-25 DIAGNOSIS — K6289 Other specified diseases of anus and rectum: Secondary | ICD-10-CM | POA: Insufficient documentation

## 2024-02-25 DIAGNOSIS — K601 Chronic anal fissure: Secondary | ICD-10-CM | POA: Insufficient documentation

## 2024-02-25 DIAGNOSIS — K7581 Nonalcoholic steatohepatitis (NASH): Secondary | ICD-10-CM | POA: Insufficient documentation

## 2024-02-25 DIAGNOSIS — E669 Obesity, unspecified: Secondary | ICD-10-CM | POA: Insufficient documentation

## 2024-02-25 DIAGNOSIS — I10 Essential (primary) hypertension: Secondary | ICD-10-CM | POA: Insufficient documentation

## 2024-02-25 DIAGNOSIS — Z9861 Coronary angioplasty status: Secondary | ICD-10-CM | POA: Insufficient documentation

## 2024-02-25 DIAGNOSIS — Z7984 Long term (current) use of oral hypoglycemic drugs: Secondary | ICD-10-CM | POA: Insufficient documentation

## 2024-02-25 SURGERY — SIGMOIDOSCOPY FLEXIBLE
Anesthesia: General | Wound class: Clean Contaminated Wounds-The respiratory, GI, Genital, or urinary

## 2024-02-25 MED ORDER — LACTATED RINGERS INTRAVENOUS SOLUTION
INTRAVENOUS | Status: DC | PRN
Start: 2024-02-25 — End: 2024-02-25

## 2024-02-25 MED ORDER — LIDOCAINE (PF) 100 MG/5 ML (2 %) INTRAVENOUS SYRINGE
INJECTION | Freq: Once | INTRAVENOUS | Status: DC | PRN
Start: 2024-02-25 — End: 2024-02-25
  Administered 2024-02-25: 100 mg via INTRAVENOUS

## 2024-02-25 MED ORDER — PROPOFOL 10 MG/ML INTRAVENOUS EMULSION
INTRAVENOUS | Status: DC | PRN
Start: 2024-02-25 — End: 2024-02-25
  Administered 2024-02-25: 0 ug/kg/min via INTRAVENOUS
  Administered 2024-02-25: 300 ug/kg/min via INTRAVENOUS

## 2024-02-25 SURGICAL SUPPLY — 1 items: CLEANER INSTRUMENT PRE-KLENZ 13.5 OZ (MISCELLANEOUS PT CARE ITEMS) ×1 IMPLANT

## 2024-02-25 NOTE — Discharge Instructions (Addendum)
 SURGICAL DISCHARGE INSTRUCTIONS     Dr. Cathay Clonts, Gene B, MD  performed your SIGMOIDOSCOPY FLEXIBLE today at the [facility]    [address]  [facility]:  Monday through Friday from 8 a.m. - 4 p.m.: [phone]    For T&D: [phone]  Between 4 p.m. - 8 a.m., weekends and holidays:  Call ER [phone]    PLEASE SEE WRITTEN HANDOUTS AS DISCUSSED BY YOUR NURSE    SIGNS AND SYMPTOMS OF A WOUND / INCISION INFECTION   Be sure to watch for the following:  Increase in redness or red streaks near or around the wound or incision.  Increase in pain that is intense or severe and cannot be relieved by the pain medication that your doctor has given you.  Increase in swelling that cannot be relieved by elevation of a body part, or by applying ice, if permitted.  Increase in drainage, or if yellow / green in color and smells bad. This could be on a dressing or a cast.  Increase in fever for longer than 24 hours, or an increase that is higher than 101 degrees Fahrenheit (normal body temperature is 98 degrees Fahrenheit). The incision may feel warm to the touch.    **CALL YOUR DOCTOR IF ONE OR MORE OF THESE SIGNS / SYMPTOMS SHOULD OCCUR.    ANESTHESIA INFORMATION   ANESTHESIA -- ADULT PATIENTS:  You have received intravenous sedation / general anesthesia, and you may feel drowsy and light-headed for several hours. You may even experience some forgetfulness of the procedure. DO NOT DRIVE A MOTOR VEHICLE or perform any activity requiring complete alertness or coordination until you feel fully awake in about 24-48 hours. Do not drink alcoholic beverages for at least 24 hours. Do not stay alone, you must have a responsible adult available to be with you. You may also experience a dry mouth or nausea for 24 hours. This is a normal side effect and will disappear as the effects of the medication wear off.    REMEMBER   If you experience any difficulty breathing, chest pain, bleeding that you feel is  excessive, persistent nausea or vomiting or for any other concerns:  Call your physician Dr.  Cathay Clonts, Tomas Fountain, MD   at [phone] . You may also ask to have the general doctor on call paged. They are available to you 24 hours a day.      SPECIAL INSTRUCTIONS / COMMENTS   Today's Result: posterior midline anal fissure    FOLLOW-UP APPOINTMENTS   Dr Arletha Lady office will call for surgery details  Dr Lajoyce Pikes  [phone]

## 2024-02-25 NOTE — Anesthesia Postprocedure Evaluation (Signed)
 Anesthesia Post Op Evaluation    Patient: Sue Rose  Procedure(s):  SIGMOIDOSCOPY FLEXIBLE    Last Vitals:Temperature: 36.3 C (97.4 F) (02/25/24 0906)  Heart Rate: 71 (02/25/24 1001)  BP (Non-Invasive): 138/74 (02/25/24 0906)  Respiratory Rate: 17 (02/25/24 1001)  SpO2: 99 % (02/25/24 1001)    No notable events documented.    Patient is sufficiently recovered from the effects of anesthesia to participate in the evaluation and has returned to their pre-procedure level.  Patient location during evaluation: PACU       Patient participation: complete - patient participated  Level of consciousness: awake and alert and responsive to verbal stimuli    Pain management: adequate  Airway patency: patent    Anesthetic complications: no  Cardiovascular status: acceptable  Respiratory status: acceptable  Hydration status: acceptable  Patient post-procedure temperature: Pt Normothermic   PONV Status: Absent

## 2024-02-25 NOTE — OR Surgeon (Signed)
 [facility]      Patient Name: Sue Rose, Sue Rose Number: [id number]  Date of Service: 02/25/2024   Date of Birth: [birth year]      Pre-Operative Diagnosis: Rectal Pain     Post-Operative Diagnosis: Posterior midline anal fissure    Procedure(s)/Description:  SIGMOIDOSCOPY FLEXIBLE: 54098 (CPT)     Attending Surgeon: Eliot Guernsey, MD     Anesthesia:  CRNA: Lady Pier, CRNA    Anesthesia Type: .General     Specimens Removed:  None  The patient indicates that they have read and understood the preoperative sigmoidoscopy consent form. The benefits, risks and alternatives to the procedure were discussed. I specifically discussed the risk of bleeding and/or perforation requiring operation.  The patient was given ample opportunity to ask questions which were addressed to the patient's satisfaction.  The patient indicates they have no further question and wish to proceed. Informed consent was obtained from the patient and/or medical power of attorney.  The patient was brought into the O.R. and placed on the table in the left lateral decubitus position. After digital rectal exam was performed, the flexible sigmoidoscope was inserted into the rectum and passed up into the descending colon. Exam of the colon and rectum up to this point was then performed. The scope was then withdrawn and careful exam of the colonic and rectal mucosa was performed on withdrawal of the scope. The operative findings are described as above. The patient tolerated the procedure well, no complications were encountered.    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  She wants to proceed with surgical intervention which will consist of lateral internal sphincterotomy.    Kymir Coles B. Trevionne Advani, MD, MBA, FACS  [facility] -General Surgery

## 2024-02-25 NOTE — Anesthesia Transfer of Care (Signed)
 ANESTHESIA TRANSFER OF CARE   Sue Rose is a 48 y.o. ,female, Weight: 108 kg (239 lb)   had Procedure(s):  SIGMOIDOSCOPY FLEXIBLE  performed  02/25/24   Primary Service: Eliot Guernsey, MD    Past Medical History:   Diagnosis Date   . Acquired hypothyroidism    . Bilateral knee pain    . Cyst of ovary    . Degenerative joint disease    . Esophageal reflux    . Essential hypertension    . GAD (generalized anxiety disorder)    . Gastroenteritis    . NASH (nonalcoholic steatohepatitis)    . Obesity, unspecified    . 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 Symptomatic varicose veins    . Thyroid  nodule    . Type 2 diabetes mellitus without complication    . Vitamin D  deficiency       Allergy History as of 02/25/24       ACE INHIBITORS         Noted Status Severity Type Reaction    01/21/22 1615 Rodrick Clapper, MA 01/21/22 Active    Other Adverse Reaction (Add comment)    Comments: ocugh                   I completed my transfer of care / handoff to the receiving personnel during which we discussed:        Post Location: PACU                                                           Last OR Temp: Temperature: 36.3 C (97.4 F)  ABG:  POTASSIUM   Date Value Ref Range Status   12/11/2023 4.0 3.5 - 5.1 mmol/L Final     CALCIUM   Date Value Ref Range Status   12/11/2023 9.1 8.6 - 10.3 mg/dL Final     Calculated P Axis   Date Value Ref Range Status   08/20/2023 48 degrees Final     Calculated R Axis   Date Value Ref Range Status   08/20/2023 39 degrees Final     Calculated T Axis   Date Value Ref Range Status   08/20/2023 44 degrees Final     Cath EF Quantitative   Date Value Ref Range Status   08/20/2023 60 % Final     Airway:* No LDAs found *  Blood pressure 138/74, pulse 71, temperature 36.3 C (97.4 F), resp. rate 17, height 1.651 m (5\' 5" ), weight 108 kg (239 lb), SpO2 99%.

## 2024-02-25 NOTE — Anesthesia Preprocedure Evaluation (Signed)
 ANESTHESIA PRE-OP EVALUATION  Planned Procedure: SIGMOIDOSCOPY FLEXIBLE  Review of Systems     anesthesia history negative     patient summary reviewed  nursing notes reviewed        Pulmonary   sleep apnea and CPAP,   Cardiovascular    Hypertension (Previous heart cath with no intervention.), well controlled and ECG reviewed ,No peripheral edema,  Exercise Tolerance: > or = 4 METS        GI/Hepatic/Renal    GERD, well controlled and liver disease        Endo/Other    hypothyroidism and obesity,   type 2 diabetes (BS 128 controlled with oral meds)    Neuro/Psych/MS   negative neuro/psych ROS,      Cancer    negative hematology/oncology ROS,               Physical Assessment      Airway       Mallampati: I    TM distance: <3 FB    Neck ROM: full  Mouth Opening: good.  No Facial hair  No Beard        Dental           (+) upper dentures           Pulmonary    Breath sounds clear to auscultation  (-) no rhonchi, no decreased breath sounds, no wheezes, no rales and no stridor     Cardiovascular    Rhythm: regular  Rate: Normal  (-) no friction rub, carotid bruit is not present, no peripheral edema and no murmur     Other findings          Plan  ASA 3     Planned anesthesia type: general     total intravenous anesthesia                    Intravenous induction       Anesthetic plan and risks discussed with patient  signed consent obtained          Patient's NPO status is appropriate for Anesthesia.

## 2024-02-25 NOTE — H&P (Signed)
 [facility]  General Surgery  History and Physical    Date of Service:  02/25/2024  Sue Rose, Sue Rose, 48 y.o. female  Date of Admission:  02/25/2024  Date of Birth:  05/15/1976  PCP: Sue Flowers, PA-C    Reason for admission:  Flexible sigmoidoscopy    HPI:  Sue Rose is a 48 y.o. White female who is admitted for Rectal Pain     Sue Rose presents today for evaluation and management of chronic rectal pain and the possible need for hemorrhoidectomy.  The patient underwent colonoscopy in October 16, 2022 which showed moderate internal hemorrhoids, grade 1 and a small benign polyp at 25 cm.  The patient has tried all types of hemorrhoidal creams, suppositories and therapies to no avail.  Her main complaint is discomfort along the right lateral anal area.  The patient denies any blood or purulent drainage.     In addition, the patient has a fairly large periumbilical hernia.     Negative blood thinner  Positive diabetes        Review of the result(s) of each unique test:  Patient underwent diagnostic testing ( as above ) prior to this dates visit.  I have personally reviewed the results and that serves as a component of the medical decision making for this encounter        Review of prior external note(s) from each unique source:  Patients referral to this office including a recent assessment by the referring provider.  This was reviewed by me for this unique office visit for the indication and intent of the referral as well as any pertinent medical or surgical history relevant to the patients independent evaluation by me today.    Past Medical History:   Diagnosis Date    Acquired hypothyroidism     Bilateral knee pain     Cyst of ovary     Degenerative joint disease     Esophageal reflux     Essential hypertension     GAD (generalized anxiety disorder)     Gastroenteritis     NASH (nonalcoholic steatohepatitis)     Obesity, unspecified     Radiculopathy due to disorder of intervertebral  disc of lumbar spine     Symptomatic varicose veins     Thyroid  nodule     Type 2 diabetes mellitus without complication     Vitamin D  deficiency       Past Surgical History:   Procedure Laterality Date    CARDIAC CATHETERIZATION      06/05/22 dr Pandora Bogaert    COLONOSCOPY      09/2023    HX CESAREAN SECTION      HX CHOLECYSTECTOMY      HX ENDOMETRIAL ABLATION      late 30's    HX UPPER ENDOSCOPY      09/2023      Social History     Tobacco Use    Smoking status: Never    Smokeless tobacco: Never   Substance Use Topics    Alcohol  use: Never    Drug use: Never       Family Medical History:       Problem Relation (Age of Onset)    Diabetes Mother, Father, Sister    Gallbladder disease Mother, Sister    Hypertension (High Blood Pressure) Mother, Father, Sister    MI <32 years of age Mother           Medications Prior to  Admission       Prescriptions    aspirin  81 mg Oral Tablet, Chewable    Chew 1 Tablet (81 mg total) Daily    cetirizine  (ZYRTEC ) 10 mg Oral Tablet    Take 1 Tablet (10 mg total) by mouth Once per day as needed for Allergies    cyclobenzaprine  (FLEXERIL ) 10 mg Oral Tablet    TAKE ONE TABLET (10 MG TOTAL) BY MOUTH THREE TIMES A DAY    ergocalciferol , vitamin D2, (DRISDOL ) 1,250 mcg (50,000 unit) Oral Capsule    Take 1 Capsule (50,000 Units total) by mouth Every 7 days    escitalopram  oxalate (LEXAPRO ) 20 mg Oral Tablet    Take 1 Tablet (20 mg total) by mouth Once a day    famotidine  (PEPCID ) 40 mg Oral Tablet    Take 1 Tablet (40 mg total) by mouth Twice daily    gabapentin  (NEURONTIN ) 600 mg Oral Tablet    Take 1 Tablet (600 mg total) by mouth Three times a day    hydroCHLOROthiazide  (HYDRODIURIL ) 25 mg Oral Tablet    Take 1 Tablet (25 mg total) by mouth Every morning    levothyroxine  (SYNTHROID) 50 mcg Oral Tablet    Take 1 Tablet (50 mcg total) by mouth Once a day    metFORMIN  (GLUCOPHAGE ) 500 mg Oral Tablet    Take 2 Tablets (1,000 mg total) by mouth Twice daily with food Patient only takes once a day     metoprolol succinate (TOPROL-XL) 25 mg Oral Tablet Sustained Release 24 hr    Take 0.5 Tablets (12.5 mg total) by mouth Once a day    nitroGLYCERIN  (NITROSTAT ) 0.4 mg Sublingual Tablet, Sublingual    Place 1 Tablet (0.4 mg total) under the tongue Every 5 minutes as needed for Chest pain for 3 doses over 15 minutes    olmesartan  (BENICAR ) 40 mg Oral Tablet    Take 1 Tablet (40 mg total) by mouth Once a day    omeprazole  (PRILOSEC) 40 mg Oral Capsule, Delayed Release(E.C.)    Take 1 Capsule (40 mg total) by mouth Twice daily    rosuvastatin  (CRESTOR ) 20 mg Oral Tablet    Take 1 Tablet (20 mg total) by mouth Every evening    sucralfate  (CARAFATE ) 1 gram Oral Tablet    Take 1 Tablet (1 g total) by mouth Every 6 hours    triamcinolone  acetonide (KENALOG ) 0.1 % Dental Paste    1 Application by Mucous Membrane route Three times a day           Allergies   Allergen Reactions    Ace Inhibitors  Other Adverse Reaction (Add comment)     ocugh          Patient Vitals for the past 24 hrs:   BP Temp Pulse Resp SpO2 Height Weight   02/25/24 0906 138/74 36.3 C (97.4 F) 69 18 95 % 1.651 m (5\' 5" ) 108 kg (239 lb)          General: appropriate for age. in no acute distress.    Vital signs are present above and have been reviewed by me     HEENT: Atraumatic, Normocephalic. PERRLA, EOMI. Nose clear. Throat clear.    Lungs: Nonlabored breathing with symmetric expansion.  Clear to auscultation bilaterally    Heart:Regular wth respect to rate and rythmn.    Abdomen:Soft.  Moderate sized periumbilical hernia     Rectal examination shows no evidence of any prolapsing hemorrhoids nor is there  evidence any fissure. No evidence of perirectal abscess.     Extremities:  Grossly normal with good range of motion and no major deformities.    Neuro:  Grossly normal motor and sensory function. CN's II through XII intact.    Psychiatric: Alert and oriented to person, place, and time. affect appropriate    Laboratory Data:     No results found for any  visits on 02/25/24 (from the past 24 hours).    Imaging Studies:    No orders to display        Assessment/Plan:  Rectal Pain    Flexible sigmoidoscopy scheduled for 02/25/2024     I discussed with the patient the fact that she does not need a full colonoscopy since she had 1 a little over a year ago.  I do however, would like to look at the rectal region with a flexible sigmoidoscopy to further evaluate the source of her perirectal pain.     After this has evaluated and managed, we will then address her umbilical hernia.     Discussed indications, risks, and benefits of flexible sigmoidoscopy with possible biopsy/polypectomy with the patient.  Discussed the possibility of polypectomy, biopsies, and possible repeat examinations.  Risks include bleeding, sedation risks, possibility of missed diagnosis of polyp or malignancy, and remote possibilities of perforation and death.  All questions were answered, and informed consent was clearly obtained.       This note was partially created using voice recognition software and is inherently subject to errors including those of syntax and "sound alike " substitutions which may escape proof reading. In such instances, original meaning may be extrapolated by contextual derivation.    Eliot Guernsey, MD, MBA, FACS

## 2024-02-26 ENCOUNTER — Ambulatory Visit (INDEPENDENT_AMBULATORY_CARE_PROVIDER_SITE_OTHER): Payer: Self-pay | Admitting: Internal Medicine

## 2024-02-26 ENCOUNTER — Telehealth (INDEPENDENT_AMBULATORY_CARE_PROVIDER_SITE_OTHER): Payer: Self-pay | Admitting: Surgery

## 2024-03-01 ENCOUNTER — Other Ambulatory Visit (INDEPENDENT_AMBULATORY_CARE_PROVIDER_SITE_OTHER): Payer: Self-pay | Admitting: Surgery

## 2024-03-01 DIAGNOSIS — K602 Anal fissure, unspecified: Secondary | ICD-10-CM

## 2024-03-04 ENCOUNTER — Ambulatory Visit
Admission: RE | Admit: 2024-03-04 | Discharge: 2024-03-04 | Disposition: A | Discharge status: Home / Self Care | Source: Ambulatory Visit | Attending: Surgery | Admitting: Surgery

## 2024-03-04 ENCOUNTER — Other Ambulatory Visit: Payer: Self-pay

## 2024-03-04 ENCOUNTER — Ambulatory Visit (INDEPENDENT_AMBULATORY_CARE_PROVIDER_SITE_OTHER): Payer: Self-pay | Admitting: Internal Medicine

## 2024-03-04 ENCOUNTER — Ambulatory Visit (HOSPITAL_COMMUNITY)

## 2024-03-04 DIAGNOSIS — K602 Anal fissure, unspecified: Secondary | ICD-10-CM | POA: Insufficient documentation

## 2024-03-04 LAB — BASIC METABOLIC PANEL
ANION GAP: 5 mmol/L (ref 4–13)
BUN/CREA RATIO: 10 (ref 6–22)
BUN: 7 mg/dL (ref 7–25)
CALCIUM: 9.3 mg/dL (ref 8.6–10.3)
CHLORIDE: 99 mmol/L (ref 98–107)
CO2 TOTAL: 29 mmol/L (ref 21–31)
CREATININE: 0.67 mg/dL (ref 0.60–1.30)
ESTIMATED GFR: 108 mL/min/{1.73_m2} (ref >59)
GLUCOSE: 106 mg/dL (ref 74–109)
OSMOLALITY, CALCULATED: 265 mosm/kg — ABNORMAL LOW (ref 270–290)
POTASSIUM: 3.7 mmol/L (ref 3.5–5.1)
SODIUM: 133 mmol/L — ABNORMAL LOW (ref 136–145)

## 2024-03-04 LAB — CBC WITH DIFF
BASOPHIL #: 0 10*3/uL (ref 0.00–0.10)
BASOPHIL %: 0 % (ref 0–1)
EOSINOPHIL #: 0.9 10*3/uL — ABNORMAL HIGH (ref 0.00–0.50)
EOSINOPHIL %: 11 % — ABNORMAL HIGH (ref 1–7)
HCT: 35.6 % (ref 31.2–41.9)
HGB: 12.1 g/dL (ref 10.9–14.3)
LYMPHOCYTE #: 2.1 10*3/uL (ref 1.10–3.10)
LYMPHOCYTE %: 27 % (ref 16–46)
MCH: 27.5 pg (ref 24.7–32.8)
MCHC: 33.9 g/dL (ref 32.3–35.6)
MCV: 81.4 fL (ref 75.5–95.3)
MONOCYTE #: 0.7 10*3/uL (ref 0.20–0.90)
MONOCYTE %: 8 % (ref 4–11)
MPV: 8 fL (ref 7.9–10.8)
NEUTROPHIL #: 4.2 10*3/uL (ref 1.90–8.20)
NEUTROPHIL %: 53 % (ref 43–77)
PLATELETS: 320 10*3/uL (ref 140–440)
RBC: 4.37 10*6/uL (ref 3.63–4.92)
RDW: 14.4 % (ref 12.3–17.7)
WBC: 7.9 10*3/uL (ref 3.8–11.8)

## 2024-03-05 DIAGNOSIS — K602 Anal fissure, unspecified: Secondary | ICD-10-CM

## 2024-03-05 LAB — ECG 12 LEAD
Atrial Rate: 77 {beats}/min
Calculated P Axis: 75 degrees
Calculated R Axis: 65 degrees
Calculated T Axis: 60 degrees
PR Interval: 164 ms
QRS Duration: 86 ms
QT Interval: 400 ms
QTC Calculation: 452 ms
Ventricular rate: 77 {beats}/min

## 2024-03-09 ENCOUNTER — Other Ambulatory Visit: Payer: Self-pay

## 2024-03-09 ENCOUNTER — Ambulatory Visit (HOSPITAL_COMMUNITY): Admitting: Surgery

## 2024-03-09 ENCOUNTER — Encounter (HOSPITAL_COMMUNITY)
Admission: RE | Disposition: A | Discharge status: Home / Self Care | Payer: Self-pay | Source: Ambulatory Visit | Attending: Surgery

## 2024-03-09 ENCOUNTER — Ambulatory Visit (HOSPITAL_COMMUNITY)

## 2024-03-09 ENCOUNTER — Ambulatory Visit
Admission: RE | Admit: 2024-03-09 | Discharge: 2024-03-09 | Disposition: A | Discharge status: Home / Self Care | Source: Ambulatory Visit | Attending: Surgery | Admitting: Surgery

## 2024-03-09 ENCOUNTER — Ambulatory Visit (INDEPENDENT_AMBULATORY_CARE_PROVIDER_SITE_OTHER): Admitting: Internal Medicine

## 2024-03-09 ENCOUNTER — Encounter (HOSPITAL_COMMUNITY): Payer: Self-pay | Admitting: Surgery

## 2024-03-09 DIAGNOSIS — I10 Essential (primary) hypertension: Secondary | ICD-10-CM | POA: Insufficient documentation

## 2024-03-09 DIAGNOSIS — K219 Gastro-esophageal reflux disease without esophagitis: Secondary | ICD-10-CM | POA: Insufficient documentation

## 2024-03-09 DIAGNOSIS — K601 Chronic anal fissure: Secondary | ICD-10-CM | POA: Insufficient documentation

## 2024-03-09 DIAGNOSIS — E039 Hypothyroidism, unspecified: Secondary | ICD-10-CM | POA: Insufficient documentation

## 2024-03-09 DIAGNOSIS — Z7984 Long term (current) use of oral hypoglycemic drugs: Secondary | ICD-10-CM | POA: Insufficient documentation

## 2024-03-09 DIAGNOSIS — K7581 Nonalcoholic steatohepatitis (NASH): Secondary | ICD-10-CM | POA: Insufficient documentation

## 2024-03-09 DIAGNOSIS — E119 Type 2 diabetes mellitus without complications: Secondary | ICD-10-CM | POA: Insufficient documentation

## 2024-03-09 DIAGNOSIS — Z6841 Body Mass Index (BMI) 40.0 and over, adult: Secondary | ICD-10-CM | POA: Insufficient documentation

## 2024-03-09 LAB — LAVENDER TOP TUBE

## 2024-03-09 LAB — POC BLOOD GLUCOSE (RESULTS): GLUCOSE, POC: 142 mg/dL — ABNORMAL HIGH (ref 70–100)

## 2024-03-09 LAB — HCG, SERUM QUALITATIVE, PREGNANCY: PREGNANCY, SERUM QUALITATIVE: NEGATIVE

## 2024-03-09 LAB — LIGHT GREEN TOP TUBE

## 2024-03-09 SURGERY — REPAIR ANAL FISTULA
Anesthesia: General | Site: Anus | Wound class: Clean Contaminated Wounds-The respiratory, GI, Genital, or urinary

## 2024-03-09 MED ORDER — IPRATROPIUM 0.5 MG-ALBUTEROL 3 MG (2.5 MG BASE)/3 ML NEBULIZATION SOLN
3.0000 mL | INHALATION_SOLUTION | Freq: Once | RESPIRATORY_TRACT | Status: DC | PRN
Start: 2024-03-09 — End: 2024-03-09

## 2024-03-09 MED ORDER — ONDANSETRON 4 MG DISINTEGRATING TABLET
4.0000 mg | ORAL_TABLET | Freq: Three times a day (TID) | ORAL | 0 refills | Status: DC | PRN
Start: 2024-03-09 — End: 2024-07-13

## 2024-03-09 MED ORDER — KETOROLAC 30 MG/ML (1 ML) INJECTION SOLUTION
15.0000 mg | Freq: Four times a day (QID) | INTRAMUSCULAR | Status: DC | PRN
Start: 2024-03-09 — End: 2024-03-09

## 2024-03-09 MED ORDER — ROPIVACAINE (PF) 2 MG/ML (0.2 %) INJECTION SOLUTION
Freq: Once | INTRAMUSCULAR | Status: DC | PRN
Start: 2024-03-09 — End: 2024-03-09
  Administered 2024-03-09: 20 mL via INTRAMUSCULAR

## 2024-03-09 MED ORDER — DIBUCAINE 1 % TOPICAL OINTMENT
TOPICAL_OINTMENT | Freq: Once | CUTANEOUS | Status: DC | PRN
Start: 2024-03-09 — End: 2024-03-09
  Administered 2024-03-09: 1 mg via TOPICAL

## 2024-03-09 MED ORDER — ALBUTEROL SULFATE 2.5 MG/3 ML (0.083 %) SOLUTION FOR NEBULIZATION
2.5000 mg | INHALATION_SOLUTION | Freq: Once | RESPIRATORY_TRACT | Status: DC | PRN
Start: 2024-03-09 — End: 2024-03-09

## 2024-03-09 MED ORDER — CEFAZOLIN 2 GRAM INTRAVENOUS SOLUTION
INTRAVENOUS | Status: AC
Start: 2024-03-09 — End: 2024-03-09
  Filled 2024-03-09: qty 14.71

## 2024-03-09 MED ORDER — FENTANYL (PF) 50 MCG/ML INJECTION WRAPPER
50.0000 ug | INJECTION | INTRAMUSCULAR | Status: DC | PRN
Start: 2024-03-09 — End: 2024-03-09

## 2024-03-09 MED ORDER — POLYETHYLENE GLYCOL 3350 17 GRAM ORAL POWDER PACKET
17.0000 g | Freq: Every day | ORAL | 1 refills | Status: DC
Start: 2024-03-09 — End: 2024-07-13

## 2024-03-09 MED ORDER — PROCHLORPERAZINE EDISYLATE 10 MG/2 ML (5 MG/ML) INJECTION SOLUTION
5.0000 mg | Freq: Once | INTRAMUSCULAR | Status: DC | PRN
Start: 2024-03-09 — End: 2024-03-09

## 2024-03-09 MED ORDER — ONDANSETRON HCL (PF) 4 MG/2 ML INJECTION SOLUTION
4.0000 mg | Freq: Four times a day (QID) | INTRAMUSCULAR | Status: DC | PRN
Start: 2024-03-09 — End: 2024-03-09

## 2024-03-09 MED ORDER — LACTATED RINGERS INTRAVENOUS SOLUTION
INTRAVENOUS | Status: DC
Start: 2024-03-09 — End: 2024-03-09

## 2024-03-09 MED ORDER — SODIUM CHLORIDE 0.9 % (FLUSH) INJECTION SYRINGE
3.0000 mL | INJECTION | Freq: Three times a day (TID) | INTRAMUSCULAR | Status: DC
Start: 2024-03-09 — End: 2024-03-09

## 2024-03-09 MED ORDER — ROCURONIUM 10 MG/ML INTRAVENOUS SOLUTION
Freq: Once | INTRAVENOUS | Status: DC | PRN
Start: 2024-03-09 — End: 2024-03-09
  Administered 2024-03-09: 50 mg via INTRAVENOUS

## 2024-03-09 MED ORDER — ACETAMINOPHEN 325 MG TABLET
650.0000 mg | ORAL_TABLET | ORAL | Status: DC | PRN
Start: 2024-03-09 — End: 2024-03-09

## 2024-03-09 MED ORDER — PROPOFOL 10 MG/ML IV BOLUS
INJECTION | Freq: Once | INTRAVENOUS | Status: DC | PRN
Start: 2024-03-09 — End: 2024-03-09
  Administered 2024-03-09: 180 mg via INTRAVENOUS

## 2024-03-09 MED ORDER — DEXAMETHASONE SODIUM PHOSPHATE 4 MG/ML INJECTION SOLUTION
4.0000 mg | Freq: Once | INTRAMUSCULAR | Status: AC
Start: 2024-03-09 — End: 2024-03-09
  Administered 2024-03-09: 4 mg via INTRAVENOUS

## 2024-03-09 MED ORDER — HYDROMORPHONE 2 MG/ML INJECTION WRAPPER
0.2000 mg | INJECTION | INTRAMUSCULAR | Status: DC | PRN
Start: 2024-03-09 — End: 2024-03-09

## 2024-03-09 MED ORDER — FAMOTIDINE (PF) 20 MG/2 ML INTRAVENOUS SOLUTION
20.0000 mg | Freq: Once | INTRAVENOUS | Status: AC
Start: 2024-03-09 — End: 2024-03-09
  Administered 2024-03-09: 20 mg via INTRAVENOUS

## 2024-03-09 MED ORDER — SUGAMMADEX 100 MG/ML INTRAVENOUS SOLUTION
Freq: Once | INTRAVENOUS | Status: DC | PRN
Start: 2024-03-09 — End: 2024-03-09
  Administered 2024-03-09: 300 mg via INTRAVENOUS

## 2024-03-09 MED ORDER — FENTANYL (PF) 50 MCG/ML INJECTION WRAPPER
25.0000 ug | INJECTION | INTRAMUSCULAR | Status: DC | PRN
Start: 2024-03-09 — End: 2024-03-09

## 2024-03-09 MED ORDER — METOCLOPRAMIDE 5 MG/ML INJECTION SOLUTION
10.0000 mg | Freq: Four times a day (QID) | INTRAMUSCULAR | Status: DC | PRN
Start: 2024-03-09 — End: 2024-03-09

## 2024-03-09 MED ORDER — ONDANSETRON 4 MG DISINTEGRATING TABLET
4.0000 mg | ORAL_TABLET | Freq: Four times a day (QID) | ORAL | Status: DC | PRN
Start: 2024-03-09 — End: 2024-03-09

## 2024-03-09 MED ORDER — ETHYL ALCOHOL 62 % TOPICAL SWAB
1.0000 | Freq: Two times a day (BID) | CUTANEOUS | Status: DC
Start: 2024-03-09 — End: 2024-03-09
  Administered 2024-03-09: 1 via NASAL

## 2024-03-09 MED ORDER — DEXAMETHASONE SODIUM PHOSPHATE 4 MG/ML INJECTION SOLUTION
INTRAMUSCULAR | Status: AC
Start: 2024-03-09 — End: 2024-03-09
  Filled 2024-03-09: qty 1

## 2024-03-09 MED ORDER — DOCUSATE SODIUM 100 MG CAPSULE
100.0000 mg | ORAL_CAPSULE | Freq: Every day | ORAL | 1 refills | Status: AC
Start: 2024-03-09 — End: ?

## 2024-03-09 MED ORDER — LIDOCAINE (PF) 100 MG/5 ML (2 %) INTRAVENOUS SYRINGE
INJECTION | Freq: Once | INTRAVENOUS | Status: DC | PRN
Start: 2024-03-09 — End: 2024-03-09
  Administered 2024-03-09: 80 mg via INTRAVENOUS
  Administered 2024-03-09: 20 mg via INTRAVENOUS

## 2024-03-09 MED ORDER — HYDROCODONE 5 MG-ACETAMINOPHEN 325 MG TABLET
1.0000 | ORAL_TABLET | ORAL | Status: DC | PRN
Start: 2024-03-09 — End: 2024-03-09
  Administered 2024-03-09: 1 via ORAL
  Filled 2024-03-09: qty 1

## 2024-03-09 MED ORDER — FENTANYL (PF) 50 MCG/ML INJECTION WRAPPER
INJECTION | Freq: Once | INTRAMUSCULAR | Status: DC | PRN
Start: 2024-03-09 — End: 2024-03-09
  Administered 2024-03-09 (×2): 50 ug via INTRAVENOUS

## 2024-03-09 MED ORDER — ALBUTEROL SULFATE HFA 90 MCG/ACTUATION AEROSOL INHALER
INHALATION_SPRAY | Freq: Once | RESPIRATORY_TRACT | Status: DC | PRN
Start: 2024-03-09 — End: 2024-03-09
  Administered 2024-03-09: 4 via RESPIRATORY_TRACT

## 2024-03-09 MED ORDER — SODIUM CHLORIDE 0.9 % (FLUSH) INJECTION SYRINGE
3.0000 mL | INJECTION | INTRAMUSCULAR | Status: DC | PRN
Start: 2024-03-09 — End: 2024-03-09

## 2024-03-09 MED ORDER — HYDROCODONE 7.5 MG-ACETAMINOPHEN 325 MG TABLET
1.0000 | ORAL_TABLET | Freq: Four times a day (QID) | ORAL | 0 refills | Status: DC | PRN
Start: 2024-03-09 — End: 2024-07-13

## 2024-03-09 MED ORDER — DIBUCAINE 1 % TOPICAL OINTMENT
TOPICAL_OINTMENT | Freq: Three times a day (TID) | CUTANEOUS | 3 refills | Status: AC
Start: 2024-03-09 — End: ?

## 2024-03-09 MED ORDER — OXYCODONE-ACETAMINOPHEN 5 MG-325 MG TABLET
1.0000 | ORAL_TABLET | Freq: Once | ORAL | Status: DC | PRN
Start: 2024-03-09 — End: 2024-03-09

## 2024-03-09 MED ORDER — FAMOTIDINE (PF) 20 MG/2 ML INTRAVENOUS SOLUTION
INTRAVENOUS | Status: AC
Start: 2024-03-09 — End: 2024-03-09
  Filled 2024-03-09: qty 2

## 2024-03-09 MED ORDER — FENTANYL (PF) 50 MCG/ML INJECTION SOLUTION
INTRAMUSCULAR | Status: AC
Start: 2024-03-09 — End: 2024-03-09
  Filled 2024-03-09: qty 2

## 2024-03-09 MED ORDER — CEFAZOLIN 1 GRAM SOLUTION FOR INJECTION
Freq: Once | INTRAMUSCULAR | Status: DC | PRN
Start: 2024-03-09 — End: 2024-03-09
  Administered 2024-03-09: 2000 mg via INTRAVENOUS

## 2024-03-09 MED ORDER — ONDANSETRON HCL (PF) 4 MG/2 ML INJECTION SOLUTION
Freq: Once | INTRAMUSCULAR | Status: DC | PRN
Start: 2024-03-09 — End: 2024-03-09
  Administered 2024-03-09: 4 mg via INTRAVENOUS

## 2024-03-09 SURGICAL SUPPLY — 24 items
BLADE 15 2 END CBNSTL SURG STRL DISP (SURGICAL CUTTING SUPPLIES) ×2 IMPLANT
BLADE SURG CLPR W 37.2MM GP EXIST HNDL GTT IN CHRG .23MM NONST LF  DISP (MED SURG SUPPLIES) ×1 IMPLANT
CLEANER INSTRUMENT PRE-KLENZ 13.5 OZ (MISCELLANEOUS PT CARE ITEMS) ×1 IMPLANT
COUNTER 20 CNT BLOCK ADH NEEDLE STRL LF  RD SHARP FOAM 15.75X11.5X14IN DISP (MED SURG SUPPLIES) ×1 IMPLANT
DSCT 21CM LIGASURE EXACT 40D 20.6MM 19.8MM 4MM CURVE JAW LOW TEMP PROF SURG 21.6X2MM VLAB (SURGICAL CUTTING SUPPLIES) ×1 IMPLANT
GLOVE SURG 6.5 LF  PF BEAD CUF STRL CRM 11.3IN PROTEXIS PI PLISPRN THK9.1 MIL (GLOVES AND ACCESSORIES) ×1 IMPLANT
GLOVE SURG 7 LF  PF BEAD CUF STRL CRM 11.8IN PROTEXIS PI PLISPRN THK9.1 MIL (GLOVES AND ACCESSORIES) ×4 IMPLANT
GOWN SURG LRG STD LGTH L3 HKLP CLSR RGLN SLEEVE TWL STRL LF  DISP GRN AERO BLU PRFRM FBRC (DRAPE/PACKS/SHEETS/OR TOWEL) ×1 IMPLANT
GOWN SURG XL STD LGTH L3 HKLP CLSR RGLN SLEEVE TWL STRL LF  DISP GRN AERO BLU PRFRM FBRC (DRAPE/PACKS/SHEETS/OR TOWEL) ×2 IMPLANT
LABEL MED CORRECT MED LABELING SYS 4 FLG 2 SHEET 24 PRPRNT STRL (MED SURG SUPPLIES) ×1 IMPLANT
NEEDLE HYPO  18GA 1.5IN REG WL BD PRCSNGL POLYPROP REG BVL LL HUB CLR CD DEHP-FR STRL LF  DISP (MED SURG SUPPLIES) ×1 IMPLANT
NEEDLE HYPO  23GA 1.5IN TW PRCSNGL SS POLYPROP REG BVL LL HUB DEHP-FR TRQS STRL LF  DISP (MED SURG SUPPLIES) ×1 IMPLANT
PACK SURG SIRUS OB III REINF TBL CVR GRAD UNDERBUTTOCK STRL 76X44IN 46X33.5IN POLY LF (MED SURG SUPPLIES) ×1 IMPLANT
PAD ABDOMINAL 8X7.5IN LF  STRL (WOUND CARE SUPPLY) ×1 IMPLANT
PENCIL SMOKEVAC COAT PSHBTN LF (MED SURG SUPPLIES) ×1 IMPLANT
SIGMOIDSCP RIGID 25CM 20MM KLNSPC 35V HAL GRAD OBTURATOR ILUM SYS DISP (SURGICAL INSTRUMENTS) ×1 IMPLANT
SOL IRRG 0.9% NACL 1000ML PLASTIC PR BTL ISTNC N-PYRG STRL LF (MEDICATIONS/SOLUTIONS) ×1 IMPLANT
SPONGE ABS 12.5X8CM PORCINE GELTN SRGFM THK10MM STRL DISP (WOUND CARE SUPPLY) ×1 IMPLANT
SPONGE GAUZE 4X4IN MDCHC COTTON 12 PLY TY 7 LF  STRL DISP (WOUND CARE SUPPLY) ×1 IMPLANT
SPONGE SURG 4X4IN 16 PLY XRY DETECT COTTON STRL LF  DISP (WOUND CARE SUPPLY) ×2 IMPLANT
SUTURE CHR 2-0 CT2 27IN BRN MONOF ABS (SUTURE/WOUND CLOSURE) ×2 IMPLANT
SYRINGE LL 10ML LF  STRL GRAD N-PYRG DEHP-FR PVC FREE MED DISP (MED SURG SUPPLIES) ×2 IMPLANT
TOWEL 24X16IN COTTON BLU DISP SURG STRL LF (DRAPE/PACKS/SHEETS/OR TOWEL) ×2 IMPLANT
TUBING SUCT CLR 6FT .25IN ARGYLE PVC NCDTV STR MALE FEMALE MLD CONN STRL LF (MED SURG SUPPLIES) ×1 IMPLANT

## 2024-03-09 NOTE — Anesthesia Preprocedure Evaluation (Addendum)
 ANESTHESIA PRE-OP EVALUATION  Planned Procedure: INTERNAL SPHINCTEROTOMY, FISTERTOMY (Anus)  Review of Systems     anesthesia history negative     patient summary reviewed  nursing notes reviewed        Pulmonary  negative pulmonary ROS,    Cardiovascular    Hypertension and ECG reviewed ,No peripheral edema,  Exercise Tolerance: > or = 4 METS        GI/Hepatic/Renal    GERD and liver disease        Endo/Other    hypothyroidism and morbid obesity,   type 2 diabetes    Neuro/Psych/MS   negative neuro/psych ROS,      Cancer    negative hematology/oncology ROS,                     Physical Assessment      Airway       Mallampati: II    TM distance: >3 FB    Neck ROM: full  Mouth Opening: good.  No Facial hair  No Beard        Dental           (+) upper dentures, missing             Pulmonary    Breath sounds clear to auscultation  (-) no rhonchi, no decreased breath sounds, no wheezes, no rales and no stridor     Cardiovascular    Rhythm: regular  Rate: Normal  (-) no friction rub, carotid bruit is not present, no peripheral edema and no murmur     Other findings              Plan  ASA 3     Planned anesthesia type: general     general anesthesia with endotracheal tube intubation      PONV Plan:  I plan to administer pharmcologic prophalaxis antiemetics              Intravenous induction       Anesthetic plan and risks discussed with patient  signed consent obtained          Patient's NPO status is appropriate for Anesthesia.           Plan discussed with CRNA.    (Awaiting pregnancy    Pepsid and dex in preopt      )        Temperature: 36.1 C (97 F)  Heart Rate: 77  Respiratory Rate: 18  BP (Non-Invasive): 132/67  SpO2: 96 %    BP Readings from Last 5 Encounters:   03/09/24 132/67   02/25/24 125/79   12/30/23 (!) 147/90   08/20/23 123/73   08/05/23 120/80       BMI (Calculated): 41.28 (03/09/2024  7:00 AM)      CBC  Diff   Lab Results   Component Value Date/Time    WBC 7.9 03/04/2024 03:54 PM    HGB 12.1 03/04/2024  03:54 PM    HCT 35.6 03/04/2024 03:54 PM    PLTCNT 320 03/04/2024 03:54 PM    RBC 4.37 03/04/2024 03:54 PM    MCV 81.4 03/04/2024 03:54 PM    MCHC 33.9 03/04/2024 03:54 PM    MCH 27.5 03/04/2024 03:54 PM    RDW 14.4 03/04/2024 03:54 PM    MPV 8.0 03/04/2024 03:54 PM    Lab Results   Component Value Date/Time    PMNS 53 03/04/2024 03:54 PM    LYMPHOCYTES 27 03/04/2024 03:54  PM    EOSINOPHIL 11 (H) 03/04/2024 03:54 PM    MONOCYTES 8 03/04/2024 03:54 PM    BASOPHILS 0 03/04/2024 03:54 PM    BASOPHILS 0.00 03/04/2024 03:54 PM    PMNABS 4.20 03/04/2024 03:54 PM    LYMPHSABS 2.10 03/04/2024 03:54 PM    EOSABS 0.90 (H) 03/04/2024 03:54 PM    MONOSABS 0.70 03/04/2024 03:54 PM            Basic Metabolic Profile    Lab Results   Component Value Date/Time    SODIUM 133 (L) 03/04/2024 03:54 PM    POTASSIUM 3.7 03/04/2024 03:54 PM    CHLORIDE 99 03/04/2024 03:54 PM    CO2 29 03/04/2024 03:54 PM    ANIONGAP 5 03/04/2024 03:54 PM    Lab Results   Component Value Date/Time    BUN 7 03/04/2024 03:54 PM    CREATININE 0.67 03/04/2024 03:54 PM    GLUCOSENF 106 03/04/2024 03:54 PM        Lab Results   Component Value Date    HA1C 7.4 (H) 12/11/2023       Lab Results   Component Value Date    TSH 4.628 12/11/2023          Hepatic Function    Lab Results   Component Value Date/Time    ALBUMIN 4.1 12/11/2023 02:52 PM    TOTALPROTEIN 7.5 12/11/2023 02:52 PM    ALKPHOS 75 12/11/2023 02:52 PM    PROTHROMTME 11.7 08/20/2023 11:36 AM    INR 1.00 08/20/2023 11:36 AM    Lab Results   Component Value Date/Time    AST 26 12/11/2023 02:52 PM    ALT 28 12/11/2023 02:52 PM          Risks and benefits of anesthesia have been discussed, including PONV, corneal abrasion, sore throat, damage to the mouth/throat/teeth, difficult intubation, aspiration, line infections, stroke, MI, and even death.  The patient has had a chance to ask questions of the anesthesia team, and is in agreement with the anesthetic plan.      Drexel Gentles Ellesse Antenucci DO

## 2024-03-09 NOTE — H&P (Signed)
 [facility]  General Surgery  History and Physical    Date of Service:  03/09/2024  Sue Rose, Sue Rose, 48 y.o. female  Date of Admission:  03/09/2024  Date of Birth:  29-Sep-1976  PCP: Sue Flowers, PA-C    Reason for admission:  Lateral internal sphincterotomy    HPI:  Sue Rose is a 48 y.o. 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 Despite long term therapy, she has had chronic problems and wishes to have definitive therapy.    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     Symptomatic varicose veins     Thyroid  nodule     Type 2 diabetes mellitus without complication     Vitamin D  deficiency       Past Surgical History:   Procedure Laterality Date    CARDIAC CATHETERIZATION      06/05/22 dr Pandora Bogaert    COLONOSCOPY      09/2023    HX CESAREAN SECTION      HX CHOLECYSTECTOMY      HX ENDOMETRIAL ABLATION      late 30's    HX UPPER ENDOSCOPY      09/2023      Social History     Tobacco Use    Smoking status: Never    Smokeless tobacco: Never   Substance Use Topics    Alcohol use: Never    Drug use: Never       Family Medical History:       Problem Relation (Age of Onset)    Diabetes Mother, Father, Sister    Gallbladder disease Mother, Sister    Hypertension (High Blood Pressure) Mother, Father, Sister    MI <34 years of age Mother           Medications Prior to Admission       Prescriptions    aspirin  81 mg Oral Tablet, Chewable    Chew 1 Tablet (81 mg total) Daily    cetirizine  (ZYRTEC ) 10 mg Oral Tablet    Take 1 Tablet (10 mg total) by mouth  Once per day as needed for Allergies    cyclobenzaprine  (FLEXERIL ) 10 mg Oral Tablet    TAKE ONE TABLET (10 MG TOTAL) BY MOUTH THREE TIMES A DAY    ergocalciferol , vitamin D2, (DRISDOL ) 1,250 mcg (50,000 unit) Oral Capsule    Take 1 Capsule (50,000 Units total) by mouth Every 7 days    escitalopram  oxalate (LEXAPRO ) 20 mg Oral Tablet    Take 1 Tablet (20 mg total) by mouth Once a day    famotidine  (PEPCID ) 40 mg Oral Tablet    Take 1 Tablet (40 mg total) by mouth Twice daily    gabapentin  (NEURONTIN ) 600 mg Oral Tablet    Take 1 Tablet (600 mg total) by mouth Three times a day    hydroCHLOROthiazide  (HYDRODIURIL ) 25 mg Oral Tablet    Take  1 Tablet (25 mg total) by mouth Every morning    levothyroxine  (SYNTHROID) 50 mcg Oral Tablet    Take 1 Tablet (50 mcg total) by mouth Once a day    metFORMIN  (GLUCOPHAGE ) 500 mg Oral Tablet    Take 2 Tablets (1,000 mg total) by mouth Twice daily with food Patient only takes once a day    metoprolol succinate (TOPROL-XL) 25 mg Oral Tablet Sustained Release 24 hr    Take 0.5 Tablets (12.5 mg total) by mouth Daily    nitroGLYCERIN  (NITROSTAT ) 0.4 mg Sublingual Tablet, Sublingual    Place 1 Tablet (0.4 mg total) under the tongue Every 5 minutes as needed for Chest pain for 3 doses over 15 minutes    olmesartan  (BENICAR ) 40 mg Oral Tablet    Take 1 Tablet (40 mg total) by mouth Once a day    omeprazole  (PRILOSEC) 40 mg Oral Capsule, Delayed Release(E.C.)    Take 1 Capsule (40 mg total) by mouth Twice daily    rosuvastatin  (CRESTOR ) 20 mg Oral Tablet    Take 1 Tablet (20 mg total) by mouth Every evening    sucralfate  (CARAFATE ) 1 gram Oral Tablet    Take 1 Tablet (1 g total) by mouth Every 6 hours    triamcinolone  acetonide (KENALOG ) 0.1 % Dental Paste    1 Application by Mucous Membrane route Three times a day           Allergies   Allergen Reactions    Ace Inhibitors  Other Adverse Reaction (Add comment)     ocugh          Patient Vitals for the past 24 hrs:   BP Temp Pulse Resp SpO2  Height Weight   03/09/24 0716 132/67 36.1 C (97 F) 77 18 96 % -- --   03/09/24 0700 -- -- -- -- -- 1.626 m (5\' 4" ) 109 kg (240 lb)          General: appropriate for age. in no acute distress.    Vital signs are present above and have been reviewed by me     HEENT: Atraumatic, Normocephalic. PERRLA, EOMI. Nose clear. Throat clear.    Lungs: Nonlabored breathing with symmetric expansion.  Clear to auscultation bilaterally    Heart:Regular wth respect to rate and rythmn.    Abdomen:Soft. Nontender. Nondistended and benign    Anal exam shows posterior anal fissure    Extremities:  Grossly normal with good range of motion and no major deformities.    Neuro:  Grossly normal motor and sensory function. CN's II through XII intact.    Psychiatric: Alert and oriented to person, place, and time. affect appropriate    Laboratory Data:     No results found for any visits on 03/09/24 (from the past 24 hours).    Imaging Studies:    No orders to display        Assessment/Plan:  CHONIC ANAL FISTUAL    Lateral internal sphincterotomy scheduled for Tuesday Mar 09, 2024    Discussion was held concerning the diagnosis of an anal fissure.  This is the most likely cause of the peri-anal discomfort at the current time.  Discussed the following treatment plan:  1.) Stool softeners.  This is to ease the passage of stool by the fissure and correct any constipation or straining.  OTC stool softeners should be taken twice a    day as well as fiber supplement.  2.) Medical Therapy. Traditionally Nitroglycerine ointment vs Calcium  channel blockers.  This functions to `loosen` the anal musculature to allow the fissure to heal. Main side effect is headache. Another option would be consideration of Botox.  3.) Surgery Therapy.  Lateral internal sphincterotomy to be employed once medical options have failed versus anal dilation  Plan to proceed with surgery.    This note was partially created using voice recognition software and is inherently  subject to errors including those of syntax and "sound alike " substitutions which may escape proof reading. In such instances, original meaning may be extrapolated by contextual derivation.    Eliot Guernsey, MD, MBA, FACS

## 2024-03-09 NOTE — Anesthesia Postprocedure Evaluation (Signed)
 Anesthesia Post Op Evaluation    Patient: Sue Rose  Procedure(s):  INTERNAL SPHINCTEROTOMY, FISTERTOMY    Last Vitals:Temperature: 36.2 C (97.1 F) (03/09/24 1015)  Heart Rate: 75 (03/09/24 1015)  BP (Non-Invasive): 119/64 (03/09/24 1015)  Respiratory Rate: 15 (03/09/24 1015)  SpO2: 100 % (03/09/24 1015)    No notable events documented.    Patient is sufficiently recovered from the effects of anesthesia to participate in the evaluation and has returned to their pre-procedure level.  Patient location during evaluation: PACU       Patient participation: complete - patient participated  Level of consciousness: awake and alert and responsive to verbal stimuli  Multimodal Pain Management: Multimodal analgesia used between 6 hours prior to anesthesia start to PACU discharge  Pain score: 2  Pain management: adequate  Airway patency: patent    Anesthetic complications: no  Cardiovascular status: acceptable  Respiratory status: acceptable  Hydration status: acceptable  Patient post-procedure temperature: Pt Normothermic   PONV Status: Absent  Comments: Bronchospasm waking up but responded well to albuterol. CTA B/L in pacu.

## 2024-03-09 NOTE — Discharge Instructions (Addendum)
 Follow up with Dr Cathay Clonts ON 03/30/24 AT 345    Apply peri pad to rectal area daily and as needed for drainage and to protect surgical area.    Resume home meds.    RX for home sent in to your pharmacy.

## 2024-03-09 NOTE — Anesthesia Transfer of Care (Signed)
 ANESTHESIA TRANSFER OF CARE   Sue Rose is a 48 y.o. ,female, Weight: 109 kg (240 lb)   had Procedure(s):  INTERNAL SPHINCTEROTOMY, FISTERTOMY  performed  03/09/24   Primary Service: Eliot Guernsey, MD    Past Medical History:   Diagnosis Date   . Acquired hypothyroidism    . Bilateral knee pain    . Cyst of ovary    . Degenerative joint disease    . Esophageal reflux    . Essential hypertension    . GAD (generalized anxiety disorder)    . Gastroenteritis    . NASH (nonalcoholic steatohepatitis)    . Obesity, unspecified    . 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 Symptomatic varicose veins    . Thyroid  nodule    . Type 2 diabetes mellitus without complication    . Vitamin D  deficiency       Allergy History as of 03/09/24       ACE INHIBITORS         Noted Status Severity Type Reaction    01/21/22 1615 Rodrick Clapper, MA 01/21/22 Active    Other Adverse Reaction (Add comment)    Comments: ocugh                   I completed my transfer of care / handoff to the receiving personnel during which we discussed:  Access, Airway, All key/critical aspects of case discussed, Analgesia, Antibiotics, Expectation of post procedure, Fluids/Product, Gave opportunity for questions and acknowledgement of understanding, Labs and PMHx  Report given to: Lisle Ridges, RN    Post Location: PACU                                                           Last OR Temp: Temperature: 36.6 C (97.9 F)  ABG:  POTASSIUM   Date Value Ref Range Status   03/04/2024 3.7 3.5 - 5.1 mmol/L Final     CALCIUM   Date Value Ref Range Status   03/04/2024 9.3 8.6 - 10.3 mg/dL Final     Calculated P Axis   Date Value Ref Range Status   03/04/2024 75 degrees Final     Calculated R Axis   Date Value Ref Range Status   03/04/2024 65 degrees Final     Calculated T Axis   Date Value Ref Range Status   03/04/2024 60 degrees Final     Cath EF Quantitative   Date Value Ref Range Status   08/20/2023 60 % Final     Airway:*  No LDAs found *  Blood pressure 132/67, pulse 79, temperature 36.6 C (97.9 F), resp. rate 20, height 1.626 m (5\' 4" ), weight 109 kg (240 lb), SpO2 100%.

## 2024-03-09 NOTE — Addendum Note (Signed)
 Addendum  created 03/09/24 1044 by Rolande Cleverly, CRNA    Intraprocedure Staff edited

## 2024-03-09 NOTE — Addendum Note (Signed)
 Addendum  created 03/09/24 1040 by Rolande Cleverly, CRNA    Flowsheet accepted

## 2024-03-09 NOTE — Anesthesia Transfer of Care (Signed)
 ANESTHESIA TRANSFER OF CARE   Sue Rose is a 48 y.o. ,female, Weight: 109 kg (240 lb)   had Procedure(s):  INTERNAL SPHINCTEROTOMY, FISTERTOMY  performed  03/09/24   Primary Service: Eliot Guernsey, MD    Past Medical History:   Diagnosis Date   . Acquired hypothyroidism    . Bilateral knee pain    . Cyst of ovary    . Degenerative joint disease    . Esophageal reflux    . Essential hypertension    . GAD (generalized anxiety disorder)    . Gastroenteritis    . NASH (nonalcoholic steatohepatitis)    . Obesity, unspecified    . 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 Symptomatic varicose veins    . Thyroid  nodule    . Type 2 diabetes mellitus without complication    . Vitamin D  deficiency       Allergy History as of 03/09/24       ACE INHIBITORS         Noted Status Severity Type Reaction    01/21/22 1615 Rodrick Clapper, MA 01/21/22 Active    Other Adverse Reaction (Add comment)    Comments: ocugh                   I completed my transfer of care / handoff to the receiving personnel during which we discussed:                                Additional Info:One touch 142                                   Last OR Temp: Temperature: 36.6 C (97.9 F)  ABG:  POTASSIUM   Date Value Ref Range Status   03/04/2024 3.7 3.5 - 5.1 mmol/L Final     CALCIUM   Date Value Ref Range Status   03/04/2024 9.3 8.6 - 10.3 mg/dL Final     Calculated P Axis   Date Value Ref Range Status   03/04/2024 75 degrees Final     Calculated R Axis   Date Value Ref Range Status   03/04/2024 65 degrees Final     Calculated T Axis   Date Value Ref Range Status   03/04/2024 60 degrees Final     Cath EF Quantitative   Date Value Ref Range Status   08/20/2023 60 % Final     Airway:* No LDAs found *  Blood pressure 132/67, pulse 79, temperature 36.6 C (97.9 F), resp. rate 20, height 1.626 m (5\' 4" ), weight 109 kg (240 lb), SpO2 100%.

## 2024-03-09 NOTE — Nurses Notes (Signed)
 PT TRANSPORTED TO DAY SURGERY 18 VIA STRETCHER, ON 2 L O2 NC, WITH UPPER DENTURE, ALBUTEROL INHALER, AND OINTMENT FROM SURGERY, BY RN.  PT AWAKE, ALERT, VSS. PT CARE ENDORSED TO Corinne Dicker, RN AT [phone].     VS:   BP- 129/71 (83)  HR- 82  RR- 17  T- 98.76F  O2- 94% RA    SURGICAL SITE:  RECTUM, SILK TAPE AND GAUZE  DRESSING DRY AND INTACT  NO DRAINAGE NOTED    IV SITE:   20 G L INNER ARM  LR INFUSING  DRESSING CLEAN, DRY, INTACT  NO DRAINAGE NOTED

## 2024-03-09 NOTE — OR Surgeon (Signed)
 [facility]      Patient Name: Raegin, Haines Number: [id number]  Date of Service: 03/09/2024   Date of Birth: 1976/10/05      Pre-Operative Diagnosis: CHONIC ANAL FISTUAL     Post-Operative Diagnosis: CHONIC ANAL FISTUAL    Procedure(s)/Description:  INTERNAL SPHINCTEROTOMY, FISTERTOMY: 46200 (CPT)     Findings: CHONIC ANAL FISTUAL     Attending Surgeon: Eliot Guernsey, MD     Anesthesia:  CRNA: Rolande Cleverly, CRNA    Anesthesia Type: GETA    Estimated Blood Loss:  Minimal    The patient was placed in the supine position.  After adequate anesthesia was provided, the perianal area was prepped and draped in a sterile fashion.  Adequate exposure was achieved and the posterior anal fissure was identified.  Local anesthetic was infiltrated in the perianal tissue. The left lateral internal sphincter was exposed and incised using the Ligasure. Hemostasis was well obtained. The operative site was covered with a Nupercainal impregnated gauze dressing followed by additional gauze dressings and a peripad.  The patient tolerated the procedure well. No complications were encountered. The patient was taken to the recovery area in stable condition.    Venkat Ankney B. Siddalee Vanderheiden, MD, MBA, FACS  [facility] -General Surgery

## 2024-03-10 ENCOUNTER — Telehealth (INDEPENDENT_AMBULATORY_CARE_PROVIDER_SITE_OTHER): Payer: Self-pay | Admitting: Surgery

## 2024-03-16 ENCOUNTER — Other Ambulatory Visit: Payer: Self-pay

## 2024-03-16 ENCOUNTER — Ambulatory Visit: Attending: Internal Medicine | Admitting: Internal Medicine

## 2024-03-16 ENCOUNTER — Encounter (INDEPENDENT_AMBULATORY_CARE_PROVIDER_SITE_OTHER): Payer: Self-pay | Admitting: Internal Medicine

## 2024-03-16 ENCOUNTER — Other Ambulatory Visit (INDEPENDENT_AMBULATORY_CARE_PROVIDER_SITE_OTHER): Payer: Self-pay | Admitting: Internal Medicine

## 2024-03-16 ENCOUNTER — Ambulatory Visit (INDEPENDENT_AMBULATORY_CARE_PROVIDER_SITE_OTHER): Payer: Self-pay | Admitting: Internal Medicine

## 2024-03-16 ENCOUNTER — Ambulatory Visit (HOSPITAL_BASED_OUTPATIENT_CLINIC_OR_DEPARTMENT_OTHER)
Admission: RE | Admit: 2024-03-16 | Discharge: 2024-03-16 | Disposition: A | Discharge status: Home / Self Care | Source: Ambulatory Visit | Attending: Internal Medicine

## 2024-03-16 VITALS — BP 158/94 | HR 78 | Temp 97.8°F | Ht 64.0 in | Wt 238.0 lb

## 2024-03-16 DIAGNOSIS — Z7984 Long term (current) use of oral hypoglycemic drugs: Secondary | ICD-10-CM

## 2024-03-16 DIAGNOSIS — M25561 Pain in right knee: Secondary | ICD-10-CM

## 2024-03-16 DIAGNOSIS — E538 Deficiency of other specified B group vitamins: Secondary | ICD-10-CM

## 2024-03-16 DIAGNOSIS — I10 Essential (primary) hypertension: Secondary | ICD-10-CM | POA: Insufficient documentation

## 2024-03-16 DIAGNOSIS — K219 Gastro-esophageal reflux disease without esophagitis: Secondary | ICD-10-CM | POA: Insufficient documentation

## 2024-03-16 DIAGNOSIS — E559 Vitamin D deficiency, unspecified: Secondary | ICD-10-CM | POA: Insufficient documentation

## 2024-03-16 DIAGNOSIS — E039 Hypothyroidism, unspecified: Secondary | ICD-10-CM | POA: Insufficient documentation

## 2024-03-16 DIAGNOSIS — M170 Bilateral primary osteoarthritis of knee: Secondary | ICD-10-CM

## 2024-03-16 DIAGNOSIS — E782 Mixed hyperlipidemia: Secondary | ICD-10-CM

## 2024-03-16 DIAGNOSIS — E119 Type 2 diabetes mellitus without complications: Secondary | ICD-10-CM | POA: Insufficient documentation

## 2024-03-16 DIAGNOSIS — M25562 Pain in left knee: Secondary | ICD-10-CM | POA: Insufficient documentation

## 2024-03-16 NOTE — Progress Notes (Signed)
 [facility]NAL [facility]ICINE, [facility]  [address]  [place] [address]  Operated by [facility]      Name: Sue Rose                       Date of Birth: 02-22-1976   MRN:  [id number]                         Date of visit: 03/16/2024     PCP: Tia Flowers, PA-C     Subjective  Sue Rose is a 48 y.o. year old female who presents for Follow Up (Follow up pt states she has been having more knee pain than usual)   to clinic.  No specialty comments available.   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 01/21/2022    Symptomatic varicose veins 01/21/2022    Thyroid  nodule 01/21/2022    Type 2 diabetes mellitus without complication 01/21/2022    Vitamin D  deficiency 01/21/2022      Current Outpatient Medications   Medication Sig    aspirin  81 mg Oral Tablet, Chewable Chew 1 Tablet (81 mg total) Daily    cetirizine  (ZYRTEC ) 10 mg Oral Tablet Take 1 Tablet (10 mg total) by mouth Once per day as needed for Allergies    cyclobenzaprine  (FLEXERIL ) 10 mg Oral Tablet TAKE ONE TABLET (10 MG TOTAL) BY MOUTH THREE TIMES A DAY    dibucaine (NUPERCAINAL) 1 % Ointment Apply topically Three times a day    docusate sodium  (COLACE) 100 mg Oral Capsule Take 1 Capsule (100 mg total) by mouth Daily    ergocalciferol , vitamin D2, (DRISDOL ) 1,250 mcg (50,000 unit) Oral Capsule Take 1 Capsule (50,000 Units total) by mouth Every 7 days    escitalopram  oxalate (LEXAPRO ) 20 mg Oral Tablet Take 1 Tablet (20 mg total) by mouth Once a day    famotidine  (PEPCID ) 40  mg Oral Tablet Take 1 Tablet (40 mg total) by mouth Twice daily    gabapentin  (NEURONTIN ) 600 mg Oral Tablet Take 1 Tablet (600 mg total) by mouth Three times a day    hydroCHLOROthiazide  (HYDRODIURIL ) 25 mg Oral Tablet Take 1 Tablet (25 mg total) by mouth Every morning    HYDROcodone -acetaminophen  (NORCO) 7.5-325 mg Oral Tablet Take 1 Tablet by mouth Every 6 hours as needed for Pain    levothyroxine  (SYNTHROID) 50 mcg Oral Tablet Take 1 Tablet (50 mcg total) by mouth Once a day    metFORMIN  (GLUCOPHAGE ) 500 mg Oral Tablet Take 2 Tablets (1,000 mg total) by mouth Twice daily with food Patient only takes once a day    metoprolol succinate (TOPROL-XL) 25 mg Oral Tablet Sustained Release 24 hr Take 0.5 Tablets (12.5 mg total) by mouth Daily    nitroGLYCERIN  (NITROSTAT ) 0.4 mg Sublingual Tablet, Sublingual Place 1 Tablet (0.4 mg total) under the tongue Every 5 minutes as needed for Chest pain for 3 doses over 15 minutes    olmesartan  (BENICAR ) 40  mg Oral Tablet Take 1 Tablet (40 mg total) by mouth Once a day    omeprazole  (PRILOSEC) 40 mg Oral Capsule, Delayed Release(E.C.) Take 1 Capsule (40 mg total) by mouth Twice daily    ondansetron  (ZOFRAN  ODT) 4 mg Oral Tablet, Rapid Dissolve Take 1 Tablet (4 mg total) by mouth Every 8 hours as needed for Nausea/Vomiting    polyethylene glycol (MIRALAX ) 17 gram Oral Powder in Packet Take 1 Packet (17 g total) by mouth Daily    rosuvastatin  (CRESTOR ) 20 mg Oral Tablet Take 1 Tablet (20 mg total) by mouth Every evening    sucralfate  (CARAFATE ) 1 gram Oral Tablet Take 1 Tablet (1 g total) by mouth Every 6 hours    triamcinolone  acetonide (KENALOG ) 0.1 % Dental Paste 1 Application by Mucous Membrane route Three times a day (Patient not taking: Reported on 03/16/2024)        FU Visit      This 47 is here for routine  follow up      FU  chronic fistula       Dr Arletha Lady  s/p   INTERNAL SPHINCTEROTOMY, FISTERTOMY  on   5/5       Bronchospasm  after surgery  only complication   pt used  albuterol   inhaler but finished        Overall pt doing well       Fu sob   Pt was referred to and has had Work up with  Dr Pandora Bogaert   Holter monitor for dr Pandora Bogaert   and will get  results        S/p  GYN appt   Dr Guadlupe Leeds  [facility]  saw pt and now   discussing   hyst  and  to have  US  done  and fu  soon    June  25th    Ca 125 done and nl will get report   Pap  May showed  candida   and  will send in  diflucan     Will request  report       Co 5th finger  jammed  and  few  months ago       Fu Osa  pt was seeing dr Figero but he is gone   Offered to send her  to Dr V patel   She will just continue with  CPAP and will get info from   Figero and  [facility]       FU pain in  knees and legs     seen by  Ortho Dr Lanae Pinal   no surgery  no inj         FU  back pain and radiculopathy patient did  see neurosurgeon in [place] and basically was told nothing to do at this time       First to try  tylenol    tid     Refused intervention or gabapentin      Fu  Vericose veins   complains of leg pain undescribable feeling at times and concerned about her varicose veins   Pt went to see Dr Victorine Grater  He wants to do procedure but  Pt canceled it      Follow-up GI issues patient was off metformin  was doing some better  she is back on medication will need to monitor      FU TYPE 2 DM ON METFORMIN  THAN HAD GI ISSUES  WAS OFF  AND TRIED BACK ON now  But  only  500 mg one daily        INJ DISCUSSED  DUE TO  NEED FOR  CONTROL OF  BS  AND   WT LOSS NEED     TRIAL OF  TRULICITY  OZEMPIC  PT  FAILED   CO yeast with farxiga jardiance and lastly  Januvia   I reminded her that  High BS can cause  Yeast as well.    Will fu aic  This time and if elevated will have to  Either increase  Metformin  or add another agent   BS discussed    Pt has been losing wt      FU HX of  COVID   pt has not felt well since having covid         FU LOW VIT D  ON SUPPLEMENT    FU NASH       US   + FATTY LIVER  DZ       FU WT GAIN   AND METABOLIC SYNDROME X   DISCUSSED WT  LOSS MEDS  IN THE PAST     FU  GERD  PT  ON  PEPCID  AND PRILOSEC    FU  HTN BP  HAS BEEN STABLE  AT  HOME       + cough on ACE   now on  BENICAR       Pt had ablation   no menses  and does get hot flashes          REVIEW OF SYSTEMS:   Review of Systems  General: No fever.  No chills.  No weight changes.  HEENT: No vision changes.  Cardiac: No chest pain. No palpitations.  No dizziness.  No light-headedness.  No near syncope.  Resp: No dyspnea at rest, no dyspnea on exertion; no cough or hemoptysis; no orthopnea or PND.  GI: No N/V. No melena.  No bright red blood per rectum.  Ext: No edema.  No claudication.  Neuro: No focal weakness.  No numbness.  All other ROS negative.      Objective: BP (!) 158/94 (Site: Left Arm, Patient Position: Sitting)   Pulse 78   Temp 36.6 C (97.8 F)   Ht 1.626 m (5\' 4" )   Wt 108 kg (238 lb)   SpO2 97%   BMI 40.85 kg/m                PHYSICAL EXAM  Physical Exam  Gen: NAD. Alert.   HEENT: PERRL; conjunctivae clear. No JVD or carotid bruit.  Cardiac: RRR with normal S1, S2.   Lungs: Clear to auscultation bilaterally. No rales. No wheezing. No rhonchi.  Abdomen: Soft, non-tender.non-distended  nl bowel sounds  umbilical hernia  easily reducible   Extremities: No edema. No cyanosis. No clubbing.  Neurologic:  Grossly intact  Left knee lateral crepitus  right knee medial crepitus     Lab Results   Component Value Date    CHOLESTEROL 117 12/11/2023    HDLCHOL 47 12/11/2023    LDLCHOL 46 12/11/2023    TRIG 119 12/11/2023       COMPLETE BLOOD COUNT   Lab Results   Component Value Date    WBC 7.9 03/04/2024    HGB 12.1 03/04/2024    HCT 35.6 03/04/2024    PLTCNT 320 03/04/2024       DIFFERENTIAL  Lab Results   Component Value Date    PMNS 53 03/04/2024    LYMPHOCYTES 27 03/04/2024  MONOCYTES 8 03/04/2024    EOSINOPHIL 11 (H) 03/04/2024    BASOPHILS 0 03/04/2024    BASOPHILS 0.00 03/04/2024    PMNABS 4.20 03/04/2024    LYMPHSABS 2.10 03/04/2024    EOSABS 0.90 (H) 03/04/2024    MONOSABS  0.70 03/04/2024        COMPREHENSIVE METABOLIC PANEL  Lab Results   Component Value Date    SODIUM 133 (L) 03/04/2024    POTASSIUM 3.7 03/04/2024    CHLORIDE 99 03/04/2024    CO2 29 03/04/2024    ANIONGAP 5 03/04/2024    BUN 7 03/04/2024    CREATININE 0.67 03/04/2024    GLUCOSENF 106 03/04/2024    GLUCOSE negative 08/05/2023    CALCIUM 9.3 03/04/2024    ALBUMIN 4.1 12/11/2023    TOTALPROTEIN 7.5 12/11/2023    ALKPHOS 75 12/11/2023    AST 26 12/11/2023    ALT 28 12/11/2023    GFR 108 03/04/2024                THYROID  STIMULATING HORMONE  Lab Results   Component Value Date    TSH 4.628 12/11/2023        Lab Results   Component Value Date    HA1C 7.4 (H) 12/11/2023     Lab Results   Component Value Date    VITD 49 04/01/2023         No orders of the defined types were placed in this encounter.     Assessment & Plan  Type 2 diabetes mellitus without complication, without long-term current use of insulin    Essential hypertension    Acquired hypothyroidism    Gastroesophageal reflux disease without esophagitis    Vitamin D  deficiency    Mixed hyperlipidemia    B12 deficiency    Bilateral knee pain     Fu with Dr Guadlupe Leeds this summer     Xrays of knees bilateral    Referral to Dr Kathlene Paradise      Meds reviewed as well as labs.  Chart reviewed and updated.   Continue current treatment.  Keep follow-up appointment.   Vaccine hx reviewed.

## 2024-03-16 NOTE — Nursing Note (Signed)
 Follow up pt states she has been having more knee pain than usual

## 2024-03-17 ENCOUNTER — Ambulatory Visit (INDEPENDENT_AMBULATORY_CARE_PROVIDER_SITE_OTHER): Payer: Self-pay | Admitting: Internal Medicine

## 2024-03-17 LAB — CBC WITH DIFF
BASOPHIL #: 0.1 10*3/uL (ref 0.00–0.10)
BASOPHIL %: 1 % (ref 0–1)
EOSINOPHIL #: 0.7 10*3/uL — ABNORMAL HIGH (ref 0.00–0.50)
EOSINOPHIL %: 10 % — ABNORMAL HIGH (ref 1–7)
HCT: 36.1 % (ref 31.2–41.9)
HGB: 12.4 g/dL (ref 10.9–14.3)
LYMPHOCYTE #: 2.3 10*3/uL (ref 1.10–3.10)
LYMPHOCYTE %: 31 % (ref 16–46)
MCH: 28 pg (ref 24.7–32.8)
MCHC: 34.4 g/dL (ref 32.3–35.6)
MCV: 81.4 fL (ref 75.5–95.3)
MONOCYTE #: 0.6 10*3/uL (ref 0.20–0.90)
MONOCYTE %: 8 % (ref 4–11)
MPV: 8.7 fL (ref 7.9–10.8)
NEUTROPHIL #: 3.7 10*3/uL (ref 1.90–8.20)
NEUTROPHIL %: 50 % (ref 43–77)
PLATELETS: 304 10*3/uL (ref 140–440)
RBC: 4.44 10*6/uL (ref 3.63–4.92)
RDW: 14.4 % (ref 12.3–17.7)
WBC: 7.4 10*3/uL (ref 3.8–11.8)

## 2024-03-17 LAB — COMPREHENSIVE METABOLIC PNL, FASTING
ALBUMIN/GLOBULIN RATIO: 1.3 (ref 0.8–1.4)
ALBUMIN: 4.2 g/dL (ref 3.5–5.7)
ALKALINE PHOSPHATASE: 62 U/L (ref 34–104)
ALT (SGPT): 27 U/L (ref 7–52)
ANION GAP: 8 mmol/L (ref 4–13)
AST (SGOT): 30 U/L (ref 13–39)
BILIRUBIN TOTAL: 0.3 mg/dL (ref 0.3–1.0)
BUN/CREA RATIO: 11 (ref 6–22)
BUN: 7 mg/dL (ref 7–25)
CALCIUM, CORRECTED: 9.2 mg/dL (ref 8.9–10.8)
CALCIUM: 9.4 mg/dL (ref 8.6–10.3)
CHLORIDE: 96 mmol/L — ABNORMAL LOW (ref 98–107)
CO2 TOTAL: 28 mmol/L (ref 21–31)
CREATININE: 0.64 mg/dL (ref 0.60–1.30)
ESTIMATED GFR: 109 mL/min/{1.73_m2} (ref >59)
GLOBULIN: 3.3 (ref 2.0–3.5)
GLUCOSE: 96 mg/dL (ref 74–109)
OSMOLALITY, CALCULATED: 262 mosm/kg — ABNORMAL LOW (ref 270–290)
POTASSIUM: 4.2 mmol/L (ref 3.5–5.1)
PROTEIN TOTAL: 7.5 g/dL (ref 6.4–8.9)
SODIUM: 132 mmol/L — ABNORMAL LOW (ref 136–145)

## 2024-03-17 LAB — VITAMIN B12: VITAMIN B 12: 422 pg/mL (ref 180–914)

## 2024-03-17 LAB — LIPID PANEL
CHOL/HDL RATIO: 2.7
CHOLESTEROL: 125 mg/dL (ref <200)
HDL CHOL: 46 mg/dL (ref >=40)
LDL CALC: 39 mg/dL (ref 0–100)
TRIGLYCERIDES: 199 mg/dL — ABNORMAL HIGH (ref <=150)
VLDL CALC: 40 mg/dL (ref 0–50)

## 2024-03-17 LAB — THYROID STIMULATING HORMONE (SENSITIVE TSH): TSH: 1.829 u[IU]/mL (ref 0.450–5.330)

## 2024-03-17 LAB — HGA1C (HEMOGLOBIN A1C WITH EST AVG GLUCOSE): HEMOGLOBIN A1C: 7.1 % — ABNORMAL HIGH (ref 4.0–6.0)

## 2024-03-17 LAB — MICROALBUMIN/CREATININE RATIO, URINE, RANDOM
CREATININE RANDOM URINE: 97 mg/dL (ref 30–125)
MICROALBUMIN RANDOM URINE: <0.7 mg/dL

## 2024-03-17 LAB — VITAMIN D 25 TOTAL: VITAMIN D 25, TOTAL: 67.83 ng/mL (ref 30.00–100.00)

## 2024-03-26 ENCOUNTER — Telehealth (INDEPENDENT_AMBULATORY_CARE_PROVIDER_SITE_OTHER): Payer: Self-pay | Admitting: Internal Medicine

## 2024-03-30 ENCOUNTER — Other Ambulatory Visit (INDEPENDENT_AMBULATORY_CARE_PROVIDER_SITE_OTHER): Payer: Self-pay | Admitting: Internal Medicine

## 2024-03-30 ENCOUNTER — Ambulatory Visit (INDEPENDENT_AMBULATORY_CARE_PROVIDER_SITE_OTHER): Payer: Self-pay | Admitting: Surgery

## 2024-03-30 ENCOUNTER — Encounter (INDEPENDENT_AMBULATORY_CARE_PROVIDER_SITE_OTHER): Payer: Self-pay | Admitting: Surgery

## 2024-03-30 ENCOUNTER — Other Ambulatory Visit: Payer: Self-pay

## 2024-03-30 VITALS — BP 121/77 | HR 73 | Temp 97.9°F | Ht 64.0 in | Wt 238.0 lb

## 2024-03-30 DIAGNOSIS — Z9889 Other specified postprocedural states: Secondary | ICD-10-CM

## 2024-04-01 ENCOUNTER — Encounter (INDEPENDENT_AMBULATORY_CARE_PROVIDER_SITE_OTHER): Payer: Self-pay | Admitting: Surgery

## 2024-04-01 NOTE — Progress Notes (Signed)
 GENERAL SURGERY, [facility] MEDICAL GROUP GENERAL SURGERY  [address]  [place] [address]    Post-Op/Procedure Note    Name: Sue Rose MRN:  [id number]   Date:   03/30/2024 DOB:  1976-10-06 (47 y.o.)               Subjective:  Sue Rose presents 3 weeks following a lateral internal sphincterotomy.   She is eating a regular diet without difficulty.  No nausea, vomiting, or abdominal pain.  Bowel movements are Normal.    The patient is not having any pain..  No fevers.     Objective:  BP 121/77   Pulse 73   Temp 36.6 C (97.9 F)   Ht 1.626 m (5\' 4" )   Wt 108 kg (238 lb)   SpO2 98%   BMI 40.85 kg/m       General:  alert, cooperative, no distress, appears stated age  Abdomen: soft  Incision:  healing well    Path report:  na    Assessment/Plan:  Doing well postoperatively.  Activity: Pt is to increase activities as tolerated.  Continue local care  In 3 months, will check to operative site and also address her hernia.  Return in about 3 months (around 06/30/2024).    Eliot Guernsey, MD

## 2024-04-27 ENCOUNTER — Other Ambulatory Visit (INDEPENDENT_AMBULATORY_CARE_PROVIDER_SITE_OTHER): Payer: Self-pay | Admitting: Internal Medicine

## 2024-05-19 ENCOUNTER — Other Ambulatory Visit (HOSPITAL_COMMUNITY): Payer: Self-pay | Admitting: Orthopaedic Surgery

## 2024-05-19 DIAGNOSIS — M25561 Pain in right knee: Secondary | ICD-10-CM

## 2024-06-04 ENCOUNTER — Ambulatory Visit
Admission: RE | Admit: 2024-06-04 | Discharge: 2024-06-04 | Disposition: A | Discharge status: Home / Self Care | Payer: Self-pay | Source: Ambulatory Visit | Attending: Orthopaedic Surgery

## 2024-06-04 ENCOUNTER — Other Ambulatory Visit: Payer: Self-pay

## 2024-06-04 DIAGNOSIS — M25561 Pain in right knee: Secondary | ICD-10-CM | POA: Insufficient documentation

## 2024-06-05 DIAGNOSIS — M25461 Effusion, right knee: Secondary | ICD-10-CM

## 2024-06-05 DIAGNOSIS — M25561 Pain in right knee: Secondary | ICD-10-CM

## 2024-06-17 ENCOUNTER — Other Ambulatory Visit (INDEPENDENT_AMBULATORY_CARE_PROVIDER_SITE_OTHER): Payer: Self-pay | Admitting: Internal Medicine

## 2024-06-29 ENCOUNTER — Encounter (INDEPENDENT_AMBULATORY_CARE_PROVIDER_SITE_OTHER): Payer: Self-pay | Admitting: Surgery

## 2024-07-13 ENCOUNTER — Ambulatory Visit: Attending: Internal Medicine | Admitting: Internal Medicine

## 2024-07-13 ENCOUNTER — Ambulatory Visit (HOSPITAL_BASED_OUTPATIENT_CLINIC_OR_DEPARTMENT_OTHER)
Admission: RE | Admit: 2024-07-13 | Discharge: 2024-07-13 | Disposition: A | Discharge status: Home / Self Care | Source: Ambulatory Visit | Attending: Internal Medicine

## 2024-07-13 ENCOUNTER — Encounter (INDEPENDENT_AMBULATORY_CARE_PROVIDER_SITE_OTHER): Payer: Self-pay | Admitting: Internal Medicine

## 2024-07-13 ENCOUNTER — Other Ambulatory Visit: Payer: Self-pay

## 2024-07-13 ENCOUNTER — Ambulatory Visit (INDEPENDENT_AMBULATORY_CARE_PROVIDER_SITE_OTHER): Payer: Self-pay | Admitting: Internal Medicine

## 2024-07-13 VITALS — HR 88

## 2024-07-13 DIAGNOSIS — N200 Calculus of kidney: Secondary | ICD-10-CM

## 2024-07-13 DIAGNOSIS — N83202 Unspecified ovarian cyst, left side: Secondary | ICD-10-CM

## 2024-07-13 DIAGNOSIS — R110 Nausea: Secondary | ICD-10-CM

## 2024-07-13 DIAGNOSIS — R5383 Other fatigue: Secondary | ICD-10-CM

## 2024-07-13 DIAGNOSIS — R109 Unspecified abdominal pain: Secondary | ICD-10-CM

## 2024-07-13 DIAGNOSIS — K760 Fatty (change of) liver, not elsewhere classified: Secondary | ICD-10-CM

## 2024-07-13 DIAGNOSIS — R1011 Right upper quadrant pain: Secondary | ICD-10-CM

## 2024-07-13 DIAGNOSIS — R1013 Epigastric pain: Secondary | ICD-10-CM | POA: Insufficient documentation

## 2024-07-13 DIAGNOSIS — R748 Abnormal levels of other serum enzymes: Secondary | ICD-10-CM | POA: Insufficient documentation

## 2024-07-13 DIAGNOSIS — R198 Other specified symptoms and signs involving the digestive system and abdomen: Secondary | ICD-10-CM | POA: Insufficient documentation

## 2024-07-13 LAB — THYROID STIMULATING HORMONE (SENSITIVE TSH): TSH: 4.194 u[IU]/mL (ref 0.450–5.330)

## 2024-07-13 LAB — COMPREHENSIVE METABOLIC PANEL, NON-FASTING
ALBUMIN/GLOBULIN RATIO: 1.2 (ref 0.8–1.4)
ALBUMIN: 4.2 g/dL (ref 3.5–5.7)
ALKALINE PHOSPHATASE: 77 U/L (ref 34–104)
ALT (SGPT): 23 U/L (ref 7–52)
ANION GAP: 10 mmol/L (ref 4–13)
AST (SGOT): 22 U/L (ref 13–39)
BILIRUBIN TOTAL: 0.3 mg/dL (ref 0.3–1.0)
BUN/CREA RATIO: 12 (ref 6–22)
BUN: 9 mg/dL (ref 7–25)
CALCIUM, CORRECTED: 9 mg/dL (ref 8.9–10.8)
CALCIUM: 9.2 mg/dL (ref 8.6–10.3)
CHLORIDE: 97 mmol/L — ABNORMAL LOW (ref 98–107)
CO2 TOTAL: 25 mmol/L (ref 21–31)
CREATININE: 0.77 mg/dL (ref 0.60–1.30)
ESTIMATED GFR: 95 mL/min/1.73mˆ2 (ref >59)
GLOBULIN: 3.4 (ref 2.0–3.5)
GLUCOSE: 96 mg/dL (ref 74–109)
OSMOLALITY, CALCULATED: 263 mosm/kg — ABNORMAL LOW (ref 270–290)
POTASSIUM: 3.8 mmol/L (ref 3.5–5.1)
PROTEIN TOTAL: 7.6 g/dL (ref 6.4–8.9)
SODIUM: 132 mmol/L — ABNORMAL LOW (ref 136–145)

## 2024-07-13 LAB — CBC WITH DIFF
BASOPHIL #: 0.1 x10ˆ3/uL (ref 0.00–0.10)
BASOPHIL %: 1 % (ref 0–1)
EOSINOPHIL #: 0.3 x10ˆ3/uL (ref 0.00–0.50)
EOSINOPHIL %: 5 % (ref 1–7)
HCT: 35.4 % (ref 31.2–41.9)
HGB: 12.1 g/dL (ref 10.9–14.3)
LYMPHOCYTE #: 2.1 x10ˆ3/uL (ref 1.10–3.10)
LYMPHOCYTE %: 28 % (ref 16–46)
MCH: 27.4 pg (ref 24.7–32.8)
MCHC: 34 g/dL (ref 32.3–35.6)
MCV: 80.5 fL (ref 75.5–95.3)
MONOCYTE #: 0.7 x10ˆ3/uL (ref 0.20–0.90)
MONOCYTE %: 10 % (ref 4–11)
MPV: 8.5 fL (ref 7.9–10.8)
NEUTROPHIL #: 4.3 x10ˆ3/uL (ref 1.90–8.20)
NEUTROPHIL %: 57 % (ref 43–77)
PLATELETS: 395 x10ˆ3/uL (ref 140–440)
RBC: 4.4 x10ˆ6/uL (ref 3.63–4.92)
RDW: 15.1 % (ref 12.3–17.7)
WBC: 7.5 x10ˆ3/uL (ref 3.8–11.8)

## 2024-07-13 LAB — AMMONIA: AMMONIA: <10 umol/L — ABNORMAL LOW (ref 11–32)

## 2024-07-13 MED ORDER — ONDANSETRON 4 MG DISINTEGRATING TABLET
4.0000 mg | ORAL_TABLET | Freq: Three times a day (TID) | ORAL | 0 refills | Status: AC | PRN
Start: 2024-07-13 — End: ?

## 2024-07-13 NOTE — Progress Notes (Signed)
 INTERNAL MEDICINE, BUILDING A  [address]  [place] [address]  Operated by [facility]  Progress Note    Name: Sue Rose MRN:  [id number]   Date: 07/13/2024 DOB:  Aug 09, 1976 (48 y.o.)              Chief Complaint: Abdominal Pain (Started 2 weeks ago  right lump around liver area   painful to touch)       HPI: Sue Rose is a 48 y.o. female who complains of  RUQ  abdominal pain    constant  pain  about 6-7/10  sore with bending twisting   Foods bother it    Nausea  no vomiting   Bowels irregular and have been but maybe more     Pt is getting ready to have complete hysterectomy due to complex ovarian cyst   Pt does not have her GB  and most of the pain is  ruq    Bowels discussed and colonoscopy may be needed    will do CT abd to ro other underlying problem        No fever or chills   nausea on and off no vomiting        Allergies:  Allergies[1]    aspirin  81 mg Oral Tablet, Chewable, Chew 1 Tablet (81 mg total) Daily  cetirizine  (ZYRTEC ) 10 mg Oral Tablet, Take 1 Tablet (10 mg total) by mouth Once per day as needed for Allergies  cyclobenzaprine  (FLEXERIL ) 10 mg Oral Tablet, TAKE ONE TABLET (10 MG TOTAL) BY MOUTH THREE TIMES A DAY  dibucaine (NUPERCAINAL) 1 % Ointment, Apply topically Three times a day  docusate sodium  (COLACE) 100 mg Oral Capsule, Take 1 Capsule (100 mg total) by mouth Daily  ergocalciferol , vitamin D2, (DRISDOL ) 1,250 mcg (50,000 unit) Oral Capsule, TAKE ONE CAPSULE (50,000 UNITS TOTAL) BY MOUTH EVERY SEVEN DAYS  escitalopram  oxalate (LEXAPRO ) 20 mg Oral Tablet, Take 1 Tablet (20 mg total) by mouth Once a day  famotidine  (PEPCID ) 40 mg Oral Tablet, Take 1 Tablet (40 mg total) by mouth Twice daily  gabapentin  (NEURONTIN ) 600 mg Oral Tablet, TAKE ONE TABLET (600 MG TOTAL) BY MOUTH THREE TIMES A DAY  hydroCHLOROthiazide  (HYDRODIURIL ) 25 mg Oral Tablet, Take 1 Tablet (25 mg total) by mouth Every morning  levothyroxine  (SYNTHROID) 50 mcg Oral Tablet, TAKE ONE TABLET  (50 MCG TOTAL) BY MOUTH ONCE A DAY  metFORMIN  (GLUCOPHAGE ) 500 mg Oral Tablet, Take 2 Tablets (1,000 mg total) by mouth Twice daily with food Patient only takes once a day  metoprolol succinate (TOPROL-XL) 25 mg Oral Tablet Sustained Release 24 hr, Take 0.5 Tablets (12.5 mg total) by mouth Daily  nitroGLYCERIN  (NITROSTAT ) 0.4 mg Sublingual Tablet, Sublingual, Place 1 Tablet (0.4 mg total) under the tongue Every 5 minutes as needed for Chest pain for 3 doses over 15 minutes  olmesartan  (BENICAR ) 40 mg Oral Tablet, Take 1 Tablet (40 mg total) by mouth Once a day  omeprazole  (PRILOSEC) 40 mg Oral Capsule, Delayed Release(E.C.), TAKE ONE CAPSULE (40 MG TOTAL) BY MOUTH TWICE DAILY  sucralfate  (CARAFATE ) 1 gram Oral Tablet, Take 1 Tablet (1 g total) by mouth Every 6 hours  triamcinolone  acetonide (KENALOG ) 0.1 % Dental Paste, 1 Application by Mucous Membrane route Three times a day  HYDROcodone -acetaminophen  (NORCO) 7.5-325 mg Oral Tablet, Take 1 Tablet by mouth Every 6 hours as needed for Pain (Patient not taking: Reported on 03/30/2024)  ondansetron  (ZOFRAN  ODT) 4 mg Oral Tablet, Rapid Dissolve,  Take 1 Tablet (4 mg total) by mouth Every 8 hours as needed for Nausea/Vomiting (Patient not taking: Reported on 03/30/2024)  polyethylene glycol (MIRALAX ) 17 gram Oral Powder in Packet, Take 1 Packet (17 g total) by mouth Daily  rosuvastatin  (CRESTOR ) 20 mg Oral Tablet, Take 1 Tablet (20 mg total) by mouth Every evening    No facility-administered medications prior to visit.       Review of Systems -     OBJECTIVE:  Vitals:    07/13/24 1048   Pulse: 88   SpO2: 96%      Physical Exam   Alert and oriented   Lungs cta   Heart RRR   Abd  obese   some superficial tenderness  ruq  but no discrete mass  noted    Tender to palpation and into epigastric  area   no rebound some  guarding           ASSESSMENT:     ICD-10-CM    1. Abdominal fullness in right lower quadrant  R19.8 CT ABDOMEN PELVIS WO IV CONTRAST     COMPREHENSIVE METABOLIC  PANEL, NON-FASTING     CBC/DIFF     AMMONIA      2. Abdominal pain  R10.9 CT ABDOMEN PELVIS WO IV CONTRAST     COMPREHENSIVE METABOLIC PANEL, NON-FASTING     CBC/DIFF     AMMONIA     THYROID  STIMULATING HORMONE (SENSITIVE TSH)      3. Epigastric pain  R10.13 CT ABDOMEN PELVIS WO IV CONTRAST     COMPREHENSIVE METABOLIC PANEL, NON-FASTING     CBC/DIFF     AMMONIA      4. Fatigue, unspecified type  R53.83 CT ABDOMEN PELVIS WO IV CONTRAST     COMPREHENSIVE METABOLIC PANEL, NON-FASTING     CBC/DIFF     AMMONIA     THYROID  STIMULATING HORMONE (SENSITIVE TSH)      5. Dyspepsia  R10.13 CT ABDOMEN PELVIS WO IV CONTRAST     COMPREHENSIVE METABOLIC PANEL, NON-FASTING     CBC/DIFF     AMMONIA      6. Elevated liver enzymes  R74.8 COMPREHENSIVE METABOLIC PANEL, NON-FASTING     CBC/DIFF     AMMONIA               PLAN: Treatment per orders . Call or return to clinic prn if these symptoms worsen or fail to improve as anticipated.  Meds reviewed as well as labs.  Ct abd ordered stat    Need to ro stone obstruction    Pt to have surgery as described above     Labs including ammonia level  due to intermittent fatigue and confusion     Will call with reports        [name], PA-C           [1]   Allergies  Allergen Reactions    Ace Inhibitors  Other Adverse Reaction (Add comment)     ocugh

## 2024-07-23 HISTORY — PX: HX HYSTERECTOMY: SHX81

## 2024-08-05 ENCOUNTER — Ambulatory Visit: Payer: Self-pay | Attending: Internal Medicine | Admitting: Internal Medicine

## 2024-08-05 ENCOUNTER — Encounter (INDEPENDENT_AMBULATORY_CARE_PROVIDER_SITE_OTHER): Payer: Self-pay | Admitting: Internal Medicine

## 2024-08-05 ENCOUNTER — Other Ambulatory Visit: Payer: Self-pay

## 2024-08-05 DIAGNOSIS — Z9889 Other specified postprocedural states: Secondary | ICD-10-CM | POA: Insufficient documentation

## 2024-08-05 DIAGNOSIS — E039 Hypothyroidism, unspecified: Secondary | ICD-10-CM | POA: Insufficient documentation

## 2024-08-05 DIAGNOSIS — Z8719 Personal history of other diseases of the digestive system: Secondary | ICD-10-CM | POA: Insufficient documentation

## 2024-08-05 DIAGNOSIS — E782 Mixed hyperlipidemia: Secondary | ICD-10-CM | POA: Insufficient documentation

## 2024-08-05 DIAGNOSIS — E119 Type 2 diabetes mellitus without complications: Secondary | ICD-10-CM | POA: Insufficient documentation

## 2024-08-05 DIAGNOSIS — Z09 Encounter for follow-up examination after completed treatment for conditions other than malignant neoplasm: Secondary | ICD-10-CM | POA: Insufficient documentation

## 2024-08-05 DIAGNOSIS — I10 Essential (primary) hypertension: Secondary | ICD-10-CM | POA: Insufficient documentation

## 2024-08-05 DIAGNOSIS — N809 Endometriosis, unspecified: Secondary | ICD-10-CM | POA: Insufficient documentation

## 2024-08-05 DIAGNOSIS — N838 Other noninflammatory disorders of ovary, fallopian tube and broad ligament: Secondary | ICD-10-CM | POA: Insufficient documentation

## 2024-08-05 NOTE — Progress Notes (Signed)
 INTERNAL MEDICINE, BUILDING A  [address]  [place] [address]  Operated by [facility]  Transitional Care Management Note    Name: Sue Rose MRN:  [id number]   Date: 08/05/2024 Age: 48 y.o.     Chief Complaint: Hospital Follow Up Sue Rose ([place]) follow up pt has complete hysterectomy and hernia repair pt states that she is felling ok still a little sore ) and Hospital Discharge Transition       SUBJECTIVE:  Sue Rose is a 48 y.o. female presenting today for follow-up after being discharged. The main problem requiring admission was s/p complete hysterectomy and hernia . Sue Rose  sept  [address], [address]    No post op complications  No DC  on antibiotics  Oxycodone   5mg  q 4 hrs  x 2 days   than had used prn    Last oxycodone  more than  1 week ago       Bm chronic changes no new problems   Urinating wo difficulty     Tolerated pain meds    Lifting restriction  10 lbs    Pt doing well     Fu type 2 dm   last aic  7.1%   Fu HTN  BP stable  Fu HLD   labs needed  Fu  Hypothyroidism   stable          OBJECTIVE:   BP 120/70   Pulse 76   Temp 36.7 C (98.1 F)   Ht 1.626 m (5' 4)   Wt 108 kg (237 lb)   SpO2 97%   BMI 40.68 kg/m     Exam-AMB  Const  General: cooperative, healthy appearing and no acute distress  Orientation/Consciousness: patient oriented x3  HENMT  Ears: hearing grossly normal bilaterally  Eyes  General: appearance normal, both eyes and all related structures  Conjunctivae: conjunctivae normal  Sclera: sclerae normal  EOM: EOM intact bilaterally  Neck  Neck: normal visual inspection and no lymphadenopathy  Thyroid : thyroid  normal  Carotids: no bruits  Resp  Effort & Inspection: normal respiratory effort  Auscultation: clear to auscultation bilaterally, no crackles, no rales, no rhonchi and no wheezes  Cardio  Jugular venous pressure: no JVD  Rate: regular rate  Rhythm: regular rhythm  Heart Sounds: S1 normal, S2 normal, no click, no murmurs and no rubs  Bruits: no  carotid bruits  GI  Inspection: Yes normal to inspection  Palpation: soft, no hepatosplenomegaly, no guarding, no masses and non-tender      Auscultation: normal bowel sounds       4 surgical sites and umbilicus   area   well healed    Neuro  General: patient oriented x3  Extrem  General: normal to inspection, normal exam except as noted, clubbing, cyanosis or edema noted, normal gait and other  Psych  Mental Status: mental status grossly normal  Mood: congruent mood  Affect: normal affect  Insight: insight good  Judgment: judgment good       Transition of Care Contact Information  Discharge date: Discharge Date: Not Found  Transition Facility Type  Facility Name Interactive Contact(s):  Clinical Staff Name/Role who contacted     Data Reviewed  Medication Reconciliation completed    Assessment & Plan  Hospital discharge follow-up    Endometriosis    Ovarian mass    S/P hernia repair    Mixed hyperlipidemia    Acquired hypothyroidism    Essential hypertension  Type 2 diabetes mellitus without complication, without long-term current use of insulin       Other transition actions (Optional) -: Discharge documentation was reviewed no DME equipment needed  no home health needed      Fu with Sue Rose   To fu with Sue Rose  will discuss ruq fullness  ct  negative    Labs in fu    Last aic   7.1%  Declined flu shot    Op report/pathology reviewed     Sue DELENA Asa, PA-C

## 2024-08-05 NOTE — Nursing Note (Signed)
 Hospital follow up pt has complete hysterectomy and hernia repair pt states that she is felling ok still a little sore

## 2024-08-06 ENCOUNTER — Ambulatory Visit (INDEPENDENT_AMBULATORY_CARE_PROVIDER_SITE_OTHER): Payer: Self-pay | Admitting: Internal Medicine

## 2024-08-06 LAB — MAGNESIUM: MAGNESIUM: 1.8 mg/dL — ABNORMAL LOW (ref 1.9–2.7)

## 2024-08-06 LAB — CBC WITH DIFF
BASOPHIL #: 0.1 x10ˆ3/uL (ref 0.00–0.10)
BASOPHIL %: 1 % (ref 0–1)
EOSINOPHIL #: 0.7 x10ˆ3/uL — ABNORMAL HIGH (ref 0.00–0.50)
EOSINOPHIL %: 7 % (ref 1–7)
HCT: 35.9 % (ref 31.2–41.9)
HGB: 12.4 g/dL (ref 10.9–14.3)
LYMPHOCYTE #: 2.1 x10ˆ3/uL (ref 1.10–3.10)
LYMPHOCYTE %: 22 % (ref 16–46)
MCH: 27.6 pg (ref 24.7–32.8)
MCHC: 34.6 g/dL (ref 32.3–35.6)
MCV: 80 fL (ref 75.5–95.3)
MONOCYTE #: 0.7 x10ˆ3/uL (ref 0.20–0.90)
MONOCYTE %: 7 % (ref 4–11)
MPV: 8.7 fL (ref 7.9–10.8)
NEUTROPHIL #: 5.9 x10ˆ3/uL (ref 1.90–8.20)
NEUTROPHIL %: 63 % (ref 43–77)
PLATELETS: 387 x10ˆ3/uL (ref 140–440)
RBC: 4.49 x10ˆ6/uL (ref 3.63–4.92)
RDW: 14.7 % (ref 12.3–17.7)
WBC: 9.4 x10ˆ3/uL (ref 3.8–11.8)

## 2024-08-06 LAB — LIPID PANEL
CHOL/HDL RATIO: 2.6
CHOLESTEROL: 118 mg/dL (ref <200)
HDL CHOL: 46 mg/dL (ref >=40)
LDL CALC: 39 mg/dL (ref 0–100)
TRIGLYCERIDES: 166 mg/dL — ABNORMAL HIGH (ref <=150)
VLDL CALC: 33 mg/dL (ref 0–50)

## 2024-08-06 LAB — COMPREHENSIVE METABOLIC PANEL, NON-FASTING
ALBUMIN/GLOBULIN RATIO: 1.1 (ref 0.8–1.4)
ALBUMIN: 4 g/dL (ref 3.5–5.7)
ALKALINE PHOSPHATASE: 92 U/L (ref 34–104)
ALT (SGPT): 21 U/L (ref 7–52)
ANION GAP: 7 mmol/L (ref 4–13)
AST (SGOT): 23 U/L (ref 13–39)
BILIRUBIN TOTAL: 0.2 mg/dL — ABNORMAL LOW (ref 0.3–1.0)
BUN/CREA RATIO: 8 (ref 6–22)
BUN: 6 mg/dL — ABNORMAL LOW (ref 7–25)
CALCIUM, CORRECTED: 9.2 mg/dL (ref 8.9–10.8)
CALCIUM: 9.2 mg/dL (ref 8.6–10.3)
CHLORIDE: 96 mmol/L — ABNORMAL LOW (ref 98–107)
CO2 TOTAL: 27 mmol/L (ref 21–31)
CREATININE: 0.72 mg/dL (ref 0.60–1.30)
ESTIMATED GFR: 102 mL/min/1.73mˆ2 (ref >59)
GLOBULIN: 3.5 (ref 2.0–3.5)
GLUCOSE: 125 mg/dL — ABNORMAL HIGH (ref 74–109)
OSMOLALITY, CALCULATED: 260 mosm/kg — ABNORMAL LOW (ref 270–290)
POTASSIUM: 4 mmol/L (ref 3.5–5.1)
PROTEIN TOTAL: 7.5 g/dL (ref 6.4–8.9)
SODIUM: 130 mmol/L — ABNORMAL LOW (ref 136–145)

## 2024-08-06 LAB — HGA1C (HEMOGLOBIN A1C WITH EST AVG GLUCOSE): HEMOGLOBIN A1C: 7 % — ABNORMAL HIGH (ref 4.0–6.0)

## 2024-08-17 ENCOUNTER — Encounter (INDEPENDENT_AMBULATORY_CARE_PROVIDER_SITE_OTHER): Admitting: Surgery

## 2024-08-30 ENCOUNTER — Ambulatory Visit (INDEPENDENT_AMBULATORY_CARE_PROVIDER_SITE_OTHER): Admitting: Surgery

## 2024-08-30 ENCOUNTER — Other Ambulatory Visit: Payer: Self-pay

## 2024-08-30 ENCOUNTER — Encounter (INDEPENDENT_AMBULATORY_CARE_PROVIDER_SITE_OTHER): Payer: Self-pay | Admitting: Surgery

## 2024-08-30 VITALS — BP 139/84 | HR 82 | Temp 98.1°F | Resp 18 | Ht 64.0 in | Wt 237.0 lb

## 2024-08-30 DIAGNOSIS — Z4889 Encounter for other specified surgical aftercare: Secondary | ICD-10-CM

## 2024-08-30 DIAGNOSIS — Z9889 Other specified postprocedural states: Secondary | ICD-10-CM

## 2024-08-30 NOTE — Progress Notes (Signed)
 GENERAL SURGERY, [facility] MEDICAL GROUP GENERAL SURGERY  [address]  [place] [address]    Progress Note    Name: Sue Rose MRN:  [id number]   Date: 08/30/2024 DOB:  [birth year] (48 y.o.)                Date of Service:  08/30/2024  Sue Rose, 48 y.o. female  Date of Birth:  1976/04/24  PCP: [name], PA-C  Referring:  [name]     HPI:  Sue Rose is a 48 y.o. White female who returns for a follow up after lateral internal sphincterotomy. She has done well with no complaints.          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     Symptomatic varicose veins     Thyroid  nodule     Type 2 diabetes mellitus without complication     Vitamin D  deficiency       Past Surgical History:   Procedure Laterality Date    CARDIAC CATHETERIZATION      06/05/22 dr sheralyn    COLONOSCOPY      09/2023    HX CESAREAN SECTION      HX CHOLECYSTECTOMY      HX ENDOMETRIAL ABLATION      late 30's    HX HYSTERECTOMY  07/23/2024    s/p peritoneum bx   and umbilical hernia sac  removal  dr Levy [facility] ENDOSCOPY      09/2023      Outpatient Medications Marked as Taking for the 08/30/24 encounter (Office Visit) with Markiesha Delia B, MD   Medication Sig    aspirin  81 mg Oral Tablet, Chewable Chew 1 Tablet (81 mg total) Daily    cetirizine  (ZYRTEC ) 10 mg Oral Tablet Take 1 Tablet (10 mg total) by mouth Once per day as needed for Allergies    cyclobenzaprine  (FLEXERIL ) 10 mg Oral Tablet TAKE ONE TABLET (10 MG TOTAL) BY MOUTH THREE TIMES A DAY    dibucaine (NUPERCAINAL) 1 % Ointment Apply topically Three times a day    docusate sodium  (COLACE) 100 mg Oral Capsule Take 1  Capsule (100 mg total) by mouth Daily    ergocalciferol , vitamin D2, (DRISDOL ) 1,250 mcg (50,000 unit) Oral Capsule TAKE ONE CAPSULE (50,000 UNITS TOTAL) BY MOUTH EVERY SEVEN DAYS    escitalopram  oxalate (LEXAPRO ) 20 mg Oral Tablet Take 1 Tablet (20 mg total) by mouth Once a day    famotidine  (PEPCID ) 40 mg Oral Tablet Take 1 Tablet (40 mg total) by mouth Twice daily    gabapentin  (NEURONTIN ) 600 mg Oral Tablet TAKE ONE TABLET (600 MG TOTAL) BY MOUTH THREE TIMES A DAY    hydroCHLOROthiazide  (HYDRODIURIL ) 25 mg Oral Tablet Take 1 Tablet (25 mg total) by mouth Every morning    Ibuprofen (MOTRIN) 600 mg Oral Tablet Take 1 Tablet (600 mg total) by mouth    levothyroxine  (SYNTHROID) 50 mcg Oral Tablet TAKE ONE TABLET (50 MCG TOTAL) BY MOUTH  ONCE A DAY    metFORMIN  (GLUCOPHAGE ) 500 mg Oral Tablet Take 2 Tablets (1,000 mg total) by mouth Twice daily with food Patient only takes once a day    metoprolol succinate (TOPROL-XL) 25 mg Oral Tablet Sustained Release 24 hr Take 0.5 Tablets (12.5 mg total) by mouth Daily    nitroGLYCERIN  (NITROSTAT ) 0.4 mg Sublingual Tablet, Sublingual Place 1 Tablet (0.4 mg total) under the tongue Every 5 minutes as needed for Chest pain for 3 doses over 15 minutes    olmesartan  (BENICAR ) 40 mg Oral Tablet Take 1 Tablet (40 mg total) by mouth Once a day    omeprazole  (PRILOSEC) 40 mg Oral Capsule, Delayed Release(E.C.) TAKE ONE CAPSULE (40 MG TOTAL) BY MOUTH TWICE DAILY    ondansetron  (ZOFRAN  ODT) 4 mg Oral Tablet, Rapid Dissolve Take 1 Tablet (4 mg total) by mouth Every 8 hours as needed for Nausea/Vomiting    rosuvastatin  (CRESTOR ) 20 mg Oral Tablet TAKE ONE TABLET (20 MG TOTAL) BY MOUTH EVERY EVENING    sucralfate  (CARAFATE ) 1 gram Oral Tablet Take 1 Tablet (1 g total) by mouth Every 6 hours    triamcinolone  acetonide (KENALOG ) 0.1 % Dental Paste 1 Application by Mucous Membrane route Three times a day      Allergies[1]        BP 139/84   Pulse 82   Temp 36.7 C (98.1 F)   Resp 18   Ht  1.626 m (5' 4)   Wt 108 kg (237 lb)   SpO2 94%   BMI 40.68 kg/m          General: appropriate for age. in no acute distress.    Vital signs are present above and have been reviewed by me     HEENT: Atraumatic, Normocephalic.    Lungs: Nonlabored breathing with symmetric expansion    Heart:Regular wth respect to rate and rythmn.    Abdomen:Soft. Nontender. Nondistended and benign    Psychiatric: Alert and oriented to person, place, and time. affect appropriate       Assessment/Plan:  Assessment/Plan   1. Post-operative state         Continue good bowel habits and anal care  Patient was very appreciative of the care given      Return if symptoms worsen or fail to improve.     This note was partially created using voice recognition software and is inherently subject to errors including those of syntax and sound alike  substitutions which may escape proof reading. In such instances, original meaning may be extrapolated by contextual derivation.    Jazmine Longshore B Javanna Patin, MD,MBA,FACS       [1]   Allergies  Allergen Reactions    Ace Inhibitors  Other Adverse Reaction (Add comment)     ocugh

## 2024-09-17 ENCOUNTER — Other Ambulatory Visit (INDEPENDENT_AMBULATORY_CARE_PROVIDER_SITE_OTHER): Payer: Self-pay | Admitting: Internal Medicine

## 2024-09-21 ENCOUNTER — Encounter (INDEPENDENT_AMBULATORY_CARE_PROVIDER_SITE_OTHER): Payer: Self-pay | Admitting: Internal Medicine

## 2024-10-15 ENCOUNTER — Other Ambulatory Visit (INDEPENDENT_AMBULATORY_CARE_PROVIDER_SITE_OTHER): Payer: Self-pay | Admitting: Internal Medicine

## 2024-11-09 ENCOUNTER — Ambulatory Visit (INDEPENDENT_AMBULATORY_CARE_PROVIDER_SITE_OTHER): Payer: Self-pay | Admitting: Internal Medicine

## 2024-11-17 ENCOUNTER — Other Ambulatory Visit (HOSPITAL_COMMUNITY): Payer: Self-pay | Admitting: Orthopaedic Surgery

## 2024-11-17 DIAGNOSIS — M25562 Pain in left knee: Secondary | ICD-10-CM

## 2024-11-25 ENCOUNTER — Ambulatory Visit (INDEPENDENT_AMBULATORY_CARE_PROVIDER_SITE_OTHER): Admitting: Internal Medicine

## 2024-11-26 ENCOUNTER — Other Ambulatory Visit (INDEPENDENT_AMBULATORY_CARE_PROVIDER_SITE_OTHER): Payer: Self-pay

## 2024-12-02 ENCOUNTER — Ambulatory Visit

## 2024-12-09 ENCOUNTER — Ambulatory Visit (INDEPENDENT_AMBULATORY_CARE_PROVIDER_SITE_OTHER): Admitting: Internal Medicine

## 2024-12-16 ENCOUNTER — Ambulatory Visit

## 2024-12-19 ENCOUNTER — Ambulatory Visit (HOSPITAL_COMMUNITY)

## 2024-12-22 ENCOUNTER — Ambulatory Visit (INDEPENDENT_AMBULATORY_CARE_PROVIDER_SITE_OTHER): Admitting: Internal Medicine
